# Patient Record
Sex: Male | Born: 1955 | Race: Black or African American | Hispanic: No | Smoking: Current every day smoker
Health system: Southern US, Community
[De-identification: ages and names within clinical notes are randomized; demographics above are authoritative.]

## PROBLEM LIST (undated history)

## (undated) DIAGNOSIS — B20 Human immunodeficiency virus [HIV] disease: Secondary | ICD-10-CM

## (undated) DIAGNOSIS — G40909 Epilepsy, unspecified, not intractable, without status epilepticus: Secondary | ICD-10-CM

## (undated) DIAGNOSIS — Z21 Asymptomatic human immunodeficiency virus [HIV] infection status: Secondary | ICD-10-CM

## (undated) HISTORY — PX: CRANIECTOMY: SHX331

---

## 1980-01-11 DIAGNOSIS — Z87820 Personal history of traumatic brain injury: Secondary | ICD-10-CM | POA: Insufficient documentation

## 1998-01-26 ENCOUNTER — Encounter (HOSPITAL_BASED_OUTPATIENT_CLINIC_OR_DEPARTMENT_OTHER): Payer: Self-pay | Admitting: Internal Medicine

## 1998-01-26 ENCOUNTER — Ambulatory Visit (HOSPITAL_COMMUNITY): Admission: RE | Admit: 1998-01-26 | Discharge: 1998-01-26 | Payer: Self-pay | Admitting: Internal Medicine

## 2017-01-09 DIAGNOSIS — Z87891 Personal history of nicotine dependence: Secondary | ICD-10-CM | POA: Insufficient documentation

## 2017-01-09 DIAGNOSIS — R569 Unspecified convulsions: Secondary | ICD-10-CM

## 2017-01-12 DIAGNOSIS — D61818 Other pancytopenia: Secondary | ICD-10-CM | POA: Insufficient documentation

## 2017-07-26 DIAGNOSIS — B181 Chronic viral hepatitis B without delta-agent: Secondary | ICD-10-CM | POA: Insufficient documentation

## 2017-07-26 DIAGNOSIS — M25521 Pain in right elbow: Secondary | ICD-10-CM | POA: Insufficient documentation

## 2017-09-01 DIAGNOSIS — F1290 Cannabis use, unspecified, uncomplicated: Secondary | ICD-10-CM | POA: Insufficient documentation

## 2017-09-01 DIAGNOSIS — K4020 Bilateral inguinal hernia, without obstruction or gangrene, not specified as recurrent: Secondary | ICD-10-CM | POA: Insufficient documentation

## 2017-09-06 DIAGNOSIS — D892 Hypergammaglobulinemia, unspecified: Secondary | ICD-10-CM | POA: Insufficient documentation

## 2018-06-22 ENCOUNTER — Inpatient Hospital Stay (HOSPITAL_COMMUNITY)
Admission: AD | Admit: 2018-06-22 | Discharge: 2018-06-24 | DRG: 812 | Disposition: A | Discharge status: Home / Self Care | Payer: Medicaid Other | Source: Other Acute Inpatient Hospital | Attending: Family Medicine | Admitting: Family Medicine

## 2018-06-22 ENCOUNTER — Encounter (HOSPITAL_COMMUNITY): Payer: Self-pay | Admitting: Internal Medicine

## 2018-06-22 DIAGNOSIS — R1084 Generalized abdominal pain: Secondary | ICD-10-CM | POA: Diagnosis present

## 2018-06-22 DIAGNOSIS — K921 Melena: Secondary | ICD-10-CM | POA: Diagnosis present

## 2018-06-22 DIAGNOSIS — K3189 Other diseases of stomach and duodenum: Secondary | ICD-10-CM | POA: Diagnosis present

## 2018-06-22 DIAGNOSIS — D62 Acute posthemorrhagic anemia: Secondary | ICD-10-CM | POA: Diagnosis present

## 2018-06-22 DIAGNOSIS — G9389 Other specified disorders of brain: Secondary | ICD-10-CM | POA: Diagnosis present

## 2018-06-22 DIAGNOSIS — E278 Other specified disorders of adrenal gland: Secondary | ICD-10-CM | POA: Diagnosis present

## 2018-06-22 DIAGNOSIS — D696 Thrombocytopenia, unspecified: Secondary | ICD-10-CM | POA: Diagnosis present

## 2018-06-22 DIAGNOSIS — F17210 Nicotine dependence, cigarettes, uncomplicated: Secondary | ICD-10-CM | POA: Diagnosis present

## 2018-06-22 DIAGNOSIS — K920 Hematemesis: Secondary | ICD-10-CM | POA: Diagnosis present

## 2018-06-22 DIAGNOSIS — K7460 Unspecified cirrhosis of liver: Secondary | ICD-10-CM | POA: Diagnosis present

## 2018-06-22 DIAGNOSIS — G40909 Epilepsy, unspecified, not intractable, without status epilepticus: Secondary | ICD-10-CM | POA: Diagnosis present

## 2018-06-22 DIAGNOSIS — R569 Unspecified convulsions: Secondary | ICD-10-CM

## 2018-06-22 DIAGNOSIS — R413 Other amnesia: Secondary | ICD-10-CM | POA: Diagnosis present

## 2018-06-22 DIAGNOSIS — K766 Portal hypertension: Secondary | ICD-10-CM | POA: Diagnosis present

## 2018-06-22 DIAGNOSIS — Z1159 Encounter for screening for other viral diseases: Secondary | ICD-10-CM

## 2018-06-22 DIAGNOSIS — C259 Malignant neoplasm of pancreas, unspecified: Secondary | ICD-10-CM | POA: Diagnosis present

## 2018-06-22 DIAGNOSIS — K869 Disease of pancreas, unspecified: Secondary | ICD-10-CM

## 2018-06-22 DIAGNOSIS — B20 Human immunodeficiency virus [HIV] disease: Secondary | ICD-10-CM

## 2018-06-22 HISTORY — DX: Human immunodeficiency virus (HIV) disease: B20

## 2018-06-22 HISTORY — DX: Epilepsy, unspecified, not intractable, without status epilepticus: G40.909

## 2018-06-22 HISTORY — DX: Asymptomatic human immunodeficiency virus (hiv) infection status: Z21

## 2018-06-22 MED ORDER — LEVETIRACETAM 500 MG PO TABS
1000.0000 mg | ORAL_TABLET | Freq: Two times a day (BID) | ORAL | Status: DC
  Administered 2018-06-22 – 2018-06-24 (×4): 1000 mg via ORAL
  Filled 2018-06-22 (×4): qty 2

## 2018-06-22 MED ORDER — ACETAMINOPHEN 650 MG RE SUPP: 650.0000 mg | Freq: Four times a day (QID) | RECTAL | Status: DC | PRN

## 2018-06-22 MED ORDER — PANCRELIPASE (LIP-PROT-AMYL) 12000-38000 UNITS PO CPEP: 12000.0000 [IU] | ORAL_CAPSULE | Freq: Three times a day (TID) | ORAL | Status: DC

## 2018-06-22 MED ORDER — PANTOPRAZOLE SODIUM 40 MG IV SOLR
40.0000 mg | Freq: Two times a day (BID) | INTRAVENOUS | Status: DC
  Administered 2018-06-22 – 2018-06-23 (×3): 40 mg via INTRAVENOUS
  Filled 2018-06-22 (×3): qty 40

## 2018-06-22 MED ORDER — ACETAMINOPHEN 325 MG PO TABS
650.0000 mg | ORAL_TABLET | Freq: Four times a day (QID) | ORAL | Status: DC | PRN
  Administered 2018-06-22: 650 mg via ORAL
  Filled 2018-06-22: qty 2

## 2018-06-22 MED ORDER — TENOFOVIR DISOPROXIL FUMARATE 300 MG PO TABS
300.0000 mg | ORAL_TABLET | Freq: Every day | ORAL | Status: DC
  Administered 2018-06-23 – 2018-06-24 (×2): 300 mg via ORAL
  Filled 2018-06-22 (×2): qty 1

## 2018-06-22 MED ORDER — HALOPERIDOL LACTATE 5 MG/ML IJ SOLN
1.0000 mg | INTRAMUSCULAR | Status: AC | PRN
  Administered 2018-06-23: 1 mg via INTRAVENOUS
  Filled 2018-06-22 (×2): qty 1

## 2018-06-22 MED ORDER — ONDANSETRON HCL 4 MG/2ML IJ SOLN
4.0000 mg | Freq: Four times a day (QID) | INTRAMUSCULAR | Status: DC | PRN
  Administered 2018-06-23 – 2018-06-24 (×2): 4 mg via INTRAVENOUS
  Filled 2018-06-22 (×3): qty 2

## 2018-06-22 MED ORDER — SODIUM CHLORIDE 0.9% FLUSH
3.0000 mL | Freq: Two times a day (BID) | INTRAVENOUS | Status: DC
  Administered 2018-06-22 – 2018-06-23 (×3): 3 mL via INTRAVENOUS

## 2018-06-22 MED ORDER — ONDANSETRON HCL 4 MG PO TABS: 4.0000 mg | ORAL_TABLET | Freq: Four times a day (QID) | ORAL | Status: DC | PRN

## 2018-06-22 MED ORDER — SODIUM CHLORIDE 0.9 % IV SOLN
INTRAVENOUS | Status: AC
  Administered 2018-06-22: 23:00:00 via INTRAVENOUS

## 2018-06-22 NOTE — Progress Notes (Addendum)
Terry Boyle is a 63 y.o. male patient admitted from [facility] awake, alert - oriented  X 2-3 - no acute distress noted.  VSS - Blood pressure (!) 154/93, pulse 75, weight 70.3 kg, SpO2 100 %.    IV in place, occlusive dsg intact without redness.  Orientation to room, and floor completed with information packet given to patient/family.  Patient declined safety video at this time.  Admission INP armband ID verified with patient/family, and in place.   SR up x 2, fall assessment complete, with patient and family able to verbalize understanding of risk associated with falls, and verbalized understanding to call nsg before up out of bed.  Call light within reach, patient able to voice, and demonstrate understanding.  Skin, clean-dry- intact without evidence of bruising, or skin tears.   No evidence of skin break down noted on exam.     Attempting to get in touch with Jennette Kettle MD so that orders can be placed.  Tama High, RN 06/22/2018 6:28 PM

## 2018-06-22 NOTE — H&P (Signed)
History and Physical    Terry Boyle IRS:[id number] DOB: [birth year] DOA: 06/22/2018  PCP: No primary care provider on file.  Patient coming from: [place] have personally briefly reviewed patient's old medical records in [place]  Chief Complaint: Abdominal pain  HPI: Terry Boyle is a 63 y.o. male with previously documented medical history significant for HIV, seizure disorder, ?  Pancreatic cancer who initially presented to [facility] on 06/20/2018 for generally feeling unwell and abdominal pain.    He underwent a CT abdomen/pelvis which was negative for bowel obstruction or acute bowel inflammation.  There were changes suggestive of advanced hepatic cirrhosis with trace pelvic ascites, periumbilical and right retroperitoneal varices, and normal-sized spleen.  Pancreatic head fullness without discrete mass was seen.  No biliary ductal dilation.  An indeterminate left adrenal nodule and small upper right renal cortical mass were also seen.  Is recommended he be admitted for further management of his symptoms at that time however he declined admission as he was feeling better and return to home.  He returned to [facility] ED on 06/21/2018 again with generalized abdominal pain without nausea, vomiting, or diarrhea.  He was noted to have a drop in hemoglobin from 11 to 7 and was agreeable to admission for further evaluation.  The hospitalist service at [facility] was consulted to admit as there is no GI services available at [facility].  On arrival during my examination patient states that he is starting to feel better.  He says he is mostly just having abdominal pain but also had emesis with bright red blood in the last 2 days.  He has noticed bright red blood in the stool as well as dark black-colored stool.  He denies any prior history of bleeding disorder or GI ulcer.  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  He does report smoking 1 pack of cigarettes per day.  He denies any alcohol or recreational drug use.  He is not aware of any medical conditions in his immediate family members.  He denies any known allergies to medications.  [facility] ED Course 06/21/18:  Initial vitals show BP 124/76, pulse 99, RR 17, temp 98.6 Fahrenheit, SPO2 100% on room air.  Labs showed sodium 141, potassium 3.9, chloride 111, bicarb 22, BUN 26, creatinine 0.82, serum glucose 105, lipase 32, AST 29, ALT 20, alk phos 61, total bilirubin 0.4, WBC 7.4, hemoglobin 7.0 (11.0 on 06/20/2018), hematocrit 20.3, MCV 108.6, platelets 82,000.  Urinalysis was negative for UTI or hematuria.  PT 12.1, INR 1.2.  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  CT abdomen/pelvis which was negative for bowel obstruction or acute bowel inflammation.  There were changes suggestive of advanced hepatic cirrhosis with trace pelvic ascites, periumbilical and right retroperitoneal varices, and normal-sized spleen.  Pancreatic head fullness without discrete mass was seen.  No biliary ductal dilation.  An indeterminate left adrenal nodule and small upper right renal cortical mass were also seen.   He was given morphine for pain and Zofran. Patient was transfused 3 units PRBCs and was started on Protonix, octreotide, and ceftriaxone per hospitalist recommendations transferred to [facility] arriving evening of 06/22/2018.  Review of Systems:  Full review of systems limited due to  encephalomalacia.   Past Medical History:  Diagnosis Date   HIV (human immunodeficiency virus infection) ([place])    Seizure disorder ([place])     Past Surgical History:  Procedure Laterality Date   CRANIECTOMY Left      Social History:  reports that he has been smoking cigarettes. He has been smoking about 1.00 pack per day. He has never used smokeless tobacco. He reports previous alcohol use. He reports previous drug use.  Not on File  Family History  Family history unknown: Yes     Prior to Admission medications   Not on File    Physical Exam: Vitals:   06/22/18 1807 06/22/18 1814  BP:  (!) 154/93  Pulse:  75  TempSrc:  Other (Comment)  SpO2:  100%  Weight: 70.3 kg     Constitutional: Resting in the left lateral decubitus position, NAD, calm, comfortable Eyes: PERRL, slight scleral icterus. ENMT: Mucous membranes are dry. Posterior pharynx clear of any exudate or lesions. Neck: normal, supple, no masses. Respiratory: clear to auscultation bilaterally, no wheezing, no crackles. Normal respiratory effort. No accessory muscle use.  Cardiovascular: Regular rate and rhythm, no murmurs / rubs / gallops. No extremity edema. 2+ pedal pulses.  Cap refill less than 2 seconds. Abdomen: no tenderness, no masses palpated. No hepatosplenomegaly. Bowel sounds positive.  Musculoskeletal: no clubbing / cyanosis. No joint deformity upper and lower extremities. Good ROM, no contractures. Normal muscle tone.  Skin: no rashes, lesions, ulcers. No induration Neurologic: CN 2-12 grossly intact. Sensation intact, Strength 5/5 in all 4.  Psychiatric: Alert and oriented x 3. Normal mood.     Labs on Admission: I have personally reviewed following labs and imaging studies  CBC: No results for input(s): WBC, NEUTROABS, HGB, HCT, MCV, PLT in the last 168 hours. Basic Metabolic Panel: No results for input(s): NA, K, CL, CO2, GLUCOSE, BUN, CREATININE, CALCIUM, MG, PHOS in the last 168 hours. GFR: CrCl cannot be calculated (No successful lab value found.). Liver Function Tests: No results for input(s): AST, ALT, ALKPHOS, BILITOT, PROT, ALBUMIN in the last 168 hours. No results for input(s): LIPASE, AMYLASE  in the last 168 hours. No results for input(s): AMMONIA in the last 168 hours. Coagulation Profile: No results for input(s): INR, PROTIME in the last 168 hours. Cardiac Enzymes: No results for input(s): CKTOTAL, CKMB, CKMBINDEX, TROPONINI in the last 168 hours. BNP (last 3 results) No results for input(s): PROBNP in the last 8760 hours. HbA1C: No results for input(s): HGBA1C in the last 72 hours. CBG: No results for input(s): GLUCAP in the last 168 hours. Lipid Profile: No results for input(s): CHOL, HDL, LDLCALC, TRIG, CHOLHDL, LDLDIRECT in the last 72 hours. Thyroid Function Tests: No results for input(s): TSH, T4TOTAL, FREET4, T3FREE, THYROIDAB in the last 72 hours. Anemia Panel: No results for input(s): VITAMINB12, FOLATE, FERRITIN, TIBC, IRON, RETICCTPCT in the last 72 hours. Urine analysis: No results found for: COLORURINE, APPEARANCEUR, LABSPEC, PHURINE, GLUCOSEU, HGBUR, BILIRUBINUR, KETONESUR, PROTEINUR, UROBILINOGEN, NITRITE, LEUKOCYTESUR  Radiological Exams on Admission: No results found.  EKG: Not performed  Assessment/Plan Principal Problem:   Acute blood loss anemia Active Problems:   HIV (human immunodeficiency virus infection) ([place])   Seizure disorder ([place])   Pancreatic disease  Terry Boyle is a 63 y.o. male with previously documented medical history significant for HIV, seizure disorder, ?  Pancreatic cancer who was admitted from [facility] rocking him with acute blood loss anemia.  Acute blood loss anemia: Patient with a 4 g drop  in hemoglobin (11 >> 7) at outside hospital within 24 hours.  Suspect upper GI bleed due to patient report of bloody emesis and stool. -Transfused 3 units PRBCs at OSH, obtain repeat labs and transfuse as needed -Continue Protonix IV 40 mg twice daily -Clear liquid diet now, n.p.o. at midnight -Avoid NSAIDs, hold pharmacologic VTE prophylaxis -Continue maintenance IV fluids overnight -Will need GI consult in a.m.  HIV: Per outside  hospital documentation, appears to be on tenofovir alone per outside hospital records. -Obtain HIV RNA, CD4 -Continue tenofovir for now  Seizure disorder/history of left craniectomy: Patient denies any recent seizures, he does not recall having prior craniectomy. -Continue Keppra thousand milligrams twice daily per outside records -Seizure precautions  Pancreatic disease: Outside records document a history of reported pancreatic cancer, unclear if this has been officially worked up and diagnosed.  CT abdomen/pelvis at outside hospital report pancreatic head fullness without discrete mass was seen.  No biliary ductal dilation.  An indeterminate left adrenal nodule and small upper right renal cortical mass were also seen.  Eventual further evaluation with MRI abdomen with and without IV contrast was recommended, if not worked up already.  Tobacco use: Reports smoking 1 pack/day.  Nicotine patch declined.   DVT prophylaxis: SCDs  Code Status: Full code, confirmed with patient Family Communication: Attempted to call patient contact listed under demographics Terry Boyle [phone]), number no longer in service. Disposition Plan: Pending clinical progress Consults called: None, will need GI consult in a.m. Admission status: Admit - It is my clinical opinion that admission to INPATIENT is reasonable and necessary because of the expectation that this patient will require hospital care that crosses at least 2 midnights to treat this condition based on the medical complexity of the problems presented.  Given the aforementioned information, the predictability of an adverse outcome is felt to be significant.    Zada Finders MD [facility]  If 7PM-7AM, please contact night-coverage [email or web address]  06/22/2018, 8:52 PM

## 2018-06-22 NOTE — Progress Notes (Signed)
Notified by lab approximately 2000 patient refusing labs. RN to bedside; patient confused and irritable. Stated he was nauseous, wanted medicine for nausea and wanted something to eat. Patient dificulty following conversation. Asked patient aware of location; did not directly answer question. Informed could have clear liquids (broth, jello and sprite), patient agreed. Patient asked if he was leaving tonight. Informed patient not aware leaving tonight and inquired what MD told him, patient stated no one has been here to see him. Asked if lab could draw labs while getting food, patient agreed. Lab to return once patient settled.   Upon return to room, patient welcoming, and asked for prayer. Offered Biomedical engineer. Asked if nauseous, stated no longer nausea.Set patient up to eat jello and drink sprite. Patient drinking out of hand and asking to hold his cup in his hand that was not present. Patient attempted to drink jello out of jello cup with wrapper still in place. Patient disrobed and when asked about gown, seemed confused about wearing gown. Patient stated wanted to go to sleep. Allowed patient to rest.   Protonix and Sandostatin infusions stopped.  Patient frequently yelling out of room for Terry Boyle (his girlfriend); patient can be reoriented and redirected.   2040 bed alarm sound, RN arrived to bedside patient OOB urine on bed and floor, trying to lift fall mats. Redirected back to bed. Low bed ordered.  Spoke with patient girlfriend Terry Boyle at 2105 via phone. She indicated patient uses cane at baseline and upset with family and not want family know where he was. Patient's girlfriend stated patient's sister named (Terry Boyle) and phone number  [phone].  Paged MD Terry Boyle at [phone] notify of patient confusion and high fall risk. Spoke with MD via phone. Telesitter ordered, informed will move pt. closer to RN station and low bed ordered. Informed of conversation with patient's Terry Boyle  about next of kin and sharing patient info, MD will follow-up. Haldol ordered PRN. Informed labs not yet drawn and confirmed ok not to apply Tele given irritability.    Bed alarm going off, patient in bathroom. Did not wait for assistance. Did not use urinal on bedside table and unsure of how to use urinal even with direction. Assisted back to bed and informed how to call for assistance. Patient confused.   Patient care completed and placed on low bed at 0130. Patient irritable and stated cold and wanted to sleep. Patient comforted and rested.   Telesitter initiated at [employer].   Notified NP Terry Boyle at [employer]. Stopped fluids & not able to do COVID swab due to restlessness/irritability.  Patient resting at 0200.   0548 trying to redirect patient how to use condom cath. Patient became irritable and complained of pain. Tried to clarify pain; patient repeatedly asked when he could go home. Informed day team MD will discuss dischare and plan with him. IV fluids stopped; patient turning and positioning occluding IV fluid administration; patient difficult to redirect.   Paged NP Terry Boyle at [phone] for pain med other than Tylenol. Patient complaining of abdominal pain.

## 2018-06-22 NOTE — Progress Notes (Signed)
Received call from Admissions that pt will be assigned to Doctor Posey Pronto. Awaiting orders. Will continue to monitor pt.

## 2018-06-23 DIAGNOSIS — G40909 Epilepsy, unspecified, not intractable, without status epilepticus: Secondary | ICD-10-CM

## 2018-06-23 DIAGNOSIS — B20 Human immunodeficiency virus [HIV] disease: Secondary | ICD-10-CM

## 2018-06-23 DIAGNOSIS — K869 Disease of pancreas, unspecified: Secondary | ICD-10-CM

## 2018-06-23 LAB — CBC WITH DIFFERENTIAL/PLATELET
Abs Immature Granulocytes: 0.01 10*3/uL (ref 0.00–0.07)
Basophils Absolute: 0 10*3/uL (ref 0.0–0.1)
Basophils Relative: 0 %
Eosinophils Absolute: 0.1 10*3/uL (ref 0.0–0.5)
Eosinophils Relative: 1 %
HCT: 25.4 % — ABNORMAL LOW (ref 39.0–52.0)
Hemoglobin: 9 g/dL — ABNORMAL LOW (ref 13.0–17.0)
Immature Granulocytes: 0 %
Lymphocytes Relative: 49 %
Lymphs Abs: 2.9 10*3/uL (ref 0.7–4.0)
MCH: 34.2 pg — ABNORMAL HIGH (ref 26.0–34.0)
MCHC: 35.4 g/dL (ref 30.0–36.0)
MCV: 96.6 fL (ref 80.0–100.0)
Monocytes Absolute: 0.5 10*3/uL (ref 0.1–1.0)
Monocytes Relative: 8 %
Neutro Abs: 2.6 10*3/uL (ref 1.7–7.7)
Neutrophils Relative %: 42 %
Platelets: 75 10*3/uL — ABNORMAL LOW (ref 150–400)
RBC: 2.63 MIL/uL — ABNORMAL LOW (ref 4.22–5.81)
RDW: 17.8 % — ABNORMAL HIGH (ref 11.5–15.5)
WBC: 6.4 10*3/uL (ref 4.0–10.5)
nRBC: 0.3 % — ABNORMAL HIGH (ref 0.0–0.2)

## 2018-06-23 LAB — COMPREHENSIVE METABOLIC PANEL
ALT: 25 U/L (ref 0–44)
AST: 33 U/L (ref 15–41)
Albumin: 2.6 g/dL — ABNORMAL LOW (ref 3.5–5.0)
Alkaline Phosphatase: 57 U/L (ref 38–126)
Anion gap: 6 (ref 5–15)
BUN: 21 mg/dL — ABNORMAL HIGH (ref 6–20)
CO2: 21 mmol/L — ABNORMAL LOW (ref 22–32)
Calcium: 7.7 mg/dL — ABNORMAL LOW (ref 8.9–10.3)
Chloride: 113 mmol/L — ABNORMAL HIGH (ref 98–111)
Creatinine, Ser: 1.2 mg/dL (ref 0.61–1.24)
GFR calc Af Amer: >60 mL/min (ref >60)
GFR calc non Af Amer: >60 mL/min (ref >60)
Glucose, Bld: 102 mg/dL — ABNORMAL HIGH (ref 70–99)
Potassium: 3.7 mmol/L (ref 3.5–5.1)
Sodium: 140 mmol/L (ref 135–145)
Total Bilirubin: 1.3 mg/dL — ABNORMAL HIGH (ref 0.3–1.2)
Total Protein: 4.8 g/dL — ABNORMAL LOW (ref 6.5–8.1)

## 2018-06-23 LAB — GLUCOSE, CAPILLARY
Glucose-Capillary: 99 mg/dL (ref 70–99)
Glucose-Capillary: 180 mg/dL — ABNORMAL HIGH (ref 70–99)
Glucose-Capillary: 73 mg/dL (ref 70–99)
Glucose-Capillary: 135 mg/dL — ABNORMAL HIGH (ref 70–99)

## 2018-06-23 LAB — SARS CORONAVIRUS 2: SARS Coronavirus 2: NOT DETECTED

## 2018-06-23 LAB — APTT: aPTT: 31 seconds (ref 24–36)

## 2018-06-23 LAB — PROTIME-INR
INR: 1.3 — ABNORMAL HIGH (ref 0.8–1.2)
Prothrombin Time: 16.4 seconds — ABNORMAL HIGH (ref 11.4–15.2)

## 2018-06-23 MED ORDER — LORAZEPAM 2 MG/ML IJ SOLN: 1.0000 mg | Freq: Once | INTRAMUSCULAR | Status: DC

## 2018-06-23 MED ORDER — MORPHINE SULFATE (PF) 2 MG/ML IV SOLN
2.0000 mg | Freq: Once | INTRAVENOUS | Status: AC
  Administered 2018-06-23: 2 mg via INTRAVENOUS
  Filled 2018-06-23: qty 1

## 2018-06-23 MED ORDER — SODIUM CHLORIDE 0.9 % IV SOLN
INTRAVENOUS | Status: DC
  Administered 2018-06-24: 09:00:00 via INTRAVENOUS

## 2018-06-23 MED ORDER — SODIUM CHLORIDE 0.9 % IV SOLN
25.0000 ug/h | INTRAVENOUS | Status: DC
  Administered 2018-06-23: 25 ug/h via INTRAVENOUS
  Filled 2018-06-23 (×2): qty 1

## 2018-06-23 NOTE — Progress Notes (Signed)
[facility]  PROGRESS NOTE  Terry Boyle LNL:[id number] DOB: Oct 17, 1955 DOA: 06/22/2018 PCP: No primary care provider on file.   Brief HPI:   63 year old male who was transferred from [facility] rocking him weight presented for abdominal pain had a CT scan abdomen which showed advanced hepatic cirrhosis with trace pelvic ascites, periumbilical and right peritoneal varices, pancreatic head fullness undetermined left adrenal nodule and a small upper right renal cortical mass.  Patient was noted to have hemoglobin drop from 11-7 and was transferred to [facility] for further GI evaluation.  Patient is a poor historian though he says that he is having dark stools and few episodes of coffee-ground emesis. CT scan performed for altered mental status shows changes of left craniotomy without acute intracranial changes.  Patient was given 3 units of PRBC and started on Protonix, octreotide, ceftriaxone and transferred to [facility] for further evaluation.    Subjective   Patient seen and examined, complains of nausea and pain.   Assessment/Plan:     1. Acute blood loss anemia-patient obese hemoglobin is around 11, presented with hemoglobin of 7.  Status post 3 units PRBC at outside hospital.  This morning hemoglobin is 9.0.  Continue Protonix 40 mg IV every 12 hours.  Will consult GI for further evaluation.  Patient will likely need endoscopy.  2. History of HIV-Per outside hospital documentation, appears to be on tenofovir alone per outside hospital records.  CD4, HIV RNA has been ordered.  Will follow the results.  3. 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  Continue Keppra 1000 mg twice a day.  Seizure precautions.  4. ?  Pancreatic disease-outside hospital records show history of reported pancreatic cancer, unclear if this has been worked up as outpatient.  CT scan of the abdomen pelvis only shows  pancreatic head fullness without discrete mass.  Further evaluation with MRI abdomen with and without IV contrast was recommended, will need to be done once patient is more stable.  Will discuss with GI.  5. Rapid SARS-CoV-2 test obtained in the hospital is negative.       CBG: Recent Labs  Lab 06/23/18 0838 06/23/18 1219  GLUCAP 99 180*    CBC: Recent Labs  Lab 06/23/18 0256  WBC 6.4  NEUTROABS 2.6  HGB 9.0*  HCT 25.4*  MCV 96.6  PLT 75*    Basic Metabolic Panel: Recent Labs  Lab 06/23/18 0256  NA 140  K 3.7  CL 113*  CO2 21*  GLUCOSE 102*  BUN 21*  CREATININE 1.20  CALCIUM 7.7*     DVT prophylaxis: SCDs  Code Status: Full code  Family Communication: No family at bedside  Disposition Plan: likely home when medically ready for discharge in next 3 to 4 days     Consultants:    Procedures:     Antibiotics:   Anti-infectives (From admission, onward)   Start     Dose/Rate Route Frequency Ordered Stop   06/23/18 1000  tenofovir (VIREAD) tablet 300 mg     300 mg Oral Daily 06/22/18 1907         Objective   Vitals:   06/22/18 1814 06/22/18 2225 06/23/18 0525 06/23/18 0832  BP: (!) 154/93 (!) 149/103 116/76 109/70  Pulse: 75 77 82 82  Resp:  16 16 19   Temp:  98.4 F (36.9 C) 98.6 F (37 C) 97.6 F (36.4 C)  TempSrc: Other (Comment) Oral Oral Oral  SpO2:  100% 100% 100% 100%  Weight:   70.1 kg     Intake/Output Summary (Last 24 hours) at 06/23/2018 1453 Last data filed at 06/23/2018 1305 Gross per 24 hour  Intake 686 ml  Output 1001 ml  Net -315 ml   Filed Weights   06/22/18 1807 06/23/18 0525  Weight: 70.3 kg 70.1 kg     Physical Examination:   General-appears in no acute distress Heart-S1-S2, regular, no murmur auscultated Lungs-clear to auscultation bilaterally, no wheezing or crackles auscultated Abdomen-soft, nontender, no organomegaly Extremities-no edema in the lower extremities Neuro-alert, oriented x3, no  focal deficit noted    Data Reviewed: I have personally reviewed following labs and imaging studies   Recent Results (from the past 240 hour(s))  SARS Coronavirus 2     Status: None   Collection Time: 06/23/18 12:45 PM  Result Value Ref Range Status   SARS Coronavirus 2 NOT DETECTED NOT DETECTED Final    Comment: (NOTE) SARS-CoV-2 target nucleic acids are NOT DETECTED. The SARS-CoV-2 RNA is generally detectable in upper and lower respiratory specimens during the acute phase of infection.  Negative  results do not preclude SARS-CoV-2 infection, do not rule out co-infections with other pathogens, and should not be used as the sole basis for treatment or other patient management decisions.  Negative results must be combined with clinical observations, patient history, and epidemiological information. The expected result is Not Detected. Fact Sheet for Patients: http://www.biofiredefense.com/wp-content/uploads/2020/03/BIOFIRE-COVID -19-patients.pdf Fact Sheet for Healthcare Providers: http://www.biofiredefense.com/wp-content/uploads/2020/03/BIOFIRE-COVID -19-hcp.pdf This test is not yet approved or cleared by the [place] and  has been authorized for detection and/or diagnosis of SARS-CoV-2 by FDA under an Emergency Use Authorization (EUA).  This EUA will remain in effec t (meaning this test can be used) for the duration of  the COVID-19 declaration under Section 564(b)(1) of the Act, 21 U.S.C. section 360bbb-3(b)(1), unless the authorization is terminated or revoked sooner. Performed at [facility], [place] [address], [place], [place] [address]      Liver Function Tests: Recent Labs  Lab 06/23/18 0256  AST 33  ALT 25  ALKPHOS 57  BILITOT 1.3*  PROT 4.8*  ALBUMIN 2.6*   No results for input(s): LIPASE, AMYLASE in the last 168 hours. No results for input(s): AMMONIA in the last 168 hours.  Cardiac Enzymes: No results for input(s): CKTOTAL, CKMB,  CKMBINDEX, TROPONINI in the last 168 hours. BNP (last 3 results) No results for input(s): BNP in the last 8760 hours.  ProBNP (last 3 results) No results for input(s): PROBNP in the last 8760 hours.    Studies: No results found.  Scheduled Meds: . levETIRAcetam  1,000 mg Oral BID  . LORazepam  1 mg Intravenous Once  . pantoprazole (PROTONIX) IV  40 mg Intravenous Q12H  . sodium chloride flush  3 mL Intravenous Q12H  . tenofovir  300 mg Oral Daily    Admission status: Inpatient: Based on patients clinical presentation and evaluation of above clinical data, I have made determination that patient meets Inpatient criteria at this time.  Time spent: 25 min  [facility] Pager [phone]. If 7PM-7AM, please contact night-coverage at [email or web address], Office  [phone]  password [email or web address]  06/23/2018, 2:53 PM  LOS: 1 day

## 2018-06-23 NOTE — H&P (View-Only) (Signed)
[facility] Consult  Referring Provider: [facility] Primary Care Physician:  No primary care provider on file. Primary Gastroenterologist: Althia Forts  Reason for Consultation: Black stools and coffee-ground emesis  HPI: Terry Boyle is a 63 y.o. male was transferred from [facility], where he presented with abdominal pain and had a CAT scan which showed advanced hepatic cirrhosis with trace pelvic ascites, periumbilical and right retroperitoneal varices, pancreatic head fullness, undetermined left adrenal nodule and a small upper right renal cortical mass.  Patient was noted to have a drop of hemoglobin from a baseline of 11-7 and was transferred for further GI evaluation. Patient states that he has had few days of dark stools and a few episodes of coffee-ground emesis. He is a poor historian and reports having a craniotomy and memory loss.  He does state that he has had EGD and colonoscopy in the past, cannot confirm the indication, findings or timing. As per documentation patient presented with a hemoglobin of 7, platelet 82, normal liver enzymes, INR 1.2, elevated BUN 26 with normal creatinine 0.8, lipase 62.  CAT scan performed for altered mental status showed changes of left craniectomy without acute intracranial changes. Patient was apparently given 3 units of PRBC and started on Protonix, octreotide, ceftriaxone and transferred to [facility] on 06/22/2018.  Past Medical History:  Diagnosis Date  . HIV (human immunodeficiency virus infection) ([place])   . Seizure disorder [facility])     Past Surgical History:  Procedure Laterality Date  . CRANIECTOMY Left     Prior to Admission medications   Not on File    Current Facility-Administered Medications  Medication Dose Route Frequency Provider Last Rate Last Dose  . acetaminophen (TYLENOL) tablet 650 mg  650 mg Oral Q6H PRN Lenore Cordia, MD   650 mg at 06/22/18 2359   Or  . acetaminophen (TYLENOL) suppository 650 mg  650 mg  Rectal Q6H PRN Zada Finders R, MD      . haloperidol lactate (HALDOL) injection 1 mg  1 mg Intravenous PRN Zada Finders R, MD      . levETIRAcetam (KEPPRA) tablet 1,000 mg  1,000 mg Oral BID Zada Finders R, MD   1,000 mg at 06/23/18 1140  . LORazepam (ATIVAN) injection 1 mg  1 mg Intravenous Once Schorr, Rhetta Mura, NP      . ondansetron (ZOFRAN) tablet 4 mg  4 mg Oral Q6H PRN Lenore Cordia, MD       Or  . ondansetron (ZOFRAN) injection 4 mg  4 mg Intravenous Q6H PRN Lenore Cordia, MD   4 mg at 06/23/18 0940  . pantoprazole (PROTONIX) injection 40 mg  40 mg Intravenous Q12H Zada Finders R, MD   40 mg at 06/23/18 1141  . sodium chloride flush (NS) 0.9 % injection 3 mL  3 mL Intravenous Q12H Zada Finders R, MD   3 mL at 06/23/18 1141  . tenofovir (VIREAD) tablet 300 mg  300 mg Oral Daily Zada Finders R, MD   300 mg at 06/23/18 1141    Allergies as of 06/22/2018  . (Not on File)    Family History  Family history unknown: Yes    Social History   Socioeconomic History  . Marital status: Divorced    Spouse name: Not on file  . Number of children: Not on file  . Years of education: Not on file  . Highest education level: Not on file  Occupational History  . Not on file  Social Needs  .  Financial resource strain: Not on file  . Food insecurity    Worry: Not on file    Inability: Not on file  . Transportation needs    Medical: Not on file    Non-medical: Not on file  Tobacco Use  . Smoking status: Current Every Day Smoker    Packs/day: 1.00    Types: Cigarettes  . Smokeless tobacco: Never Used  Substance and Sexual Activity  . Alcohol use: Not Currently  . Drug use: Not Currently  . Sexual activity: Not on file  Lifestyle  . Physical activity    Days per week: Not on file    Minutes per session: Not on file  . Stress: Not on file  Relationships  . Social Herbalist on phone: Not on file    Gets together: Not on file    Attends religious service: Not  on file    Active member of club or organization: Not on file    Attends meetings of clubs or organizations: Not on file    Relationship status: Not on file  . Intimate partner violence    Fear of current or ex partner: Not on file    Emotionally abused: Not on file    Physically abused: Not on file    Forced sexual activity: Not on file  Other Topics Concern  . Not on file  Social History Narrative  . Not on file    Review of Systems: Positive for: GI: Described in detail in HPI.    Gen: Denies any fever, chills, rigors, night sweats, anorexia, fatigue, weakness, malaise, involuntary weight loss, and sleep disorder CV: Denies chest pain, angina, palpitations, syncope, orthopnea, PND, peripheral edema, and claudication. Resp: Denies dyspnea, cough, sputum, wheezing, coughing up blood. GU : Denies urinary burning, blood in urine, urinary frequency, urinary hesitancy, nocturnal urination, and urinary incontinence. MS: Denies joint pain or swelling.  Denies muscle weakness, cramps, atrophy.  Derm: Denies rash, itching, oral ulcerations, hives, unhealing ulcers.  Psych: memory loss,Denies depression, anxiety,  suicidal ideation, hallucinations,  and confusion. Heme: Denies bruising and enlarged lymph nodes. Neuro: denies any headaches, dizziness, paresthesias. Endo:  Denies any problems with DM, thyroid, adrenal function.  Physical Exam: Vital signs in last 24 hours: Temp:  [97.6 F (36.4 C)-98.6 F (37 C)] 97.6 F (36.4 C) (06/13 0832) Pulse Rate:  [75-82] 82 (06/13 0832) Resp:  [16-19] 19 (06/13 0832) BP: (109-154)/(70-103) 109/70 (06/13 0832) SpO2:  [100 %] 100 % (06/13 0832) Weight:  [70.1 kg-70.3 kg] 70.1 kg (06/13 0525)    General:   Alert,  Well-developed, well-nourished, pleasant and cooperative in NAD Head:  Normocephalic and atraumatic. Eyes:  Sclera clear, no icterus.   Mild pallor Ears:  Normal auditory acuity. Nose:  No deformity, discharge,  or lesions. Mouth:   No deformity or lesions.  Oropharynx pink & moist. Neck:  Supple; no masses or thyromegaly. Lungs:  Clear throughout to auscultation.   No wheezes, crackles, or rhonchi. No acute distress. Heart:  Regular rate and rhythm; no murmurs, clicks, rubs,  or gallops. Extremities:  Without clubbing or edema. Neurologic:  Alert and  oriented x2;  grossly normal neurologically. Skin:  Intact without significant lesions or rashes. Psych:  Alert and cooperative. Normal mood and affect. Abdomen:  Soft, nontender and nondistended. No masses, hepatosplenomegaly or hernias noted. Normal bowel sounds, without guarding, and without rebound.         Lab Results: Recent Labs    06/23/18  0256  WBC 6.4  HGB 9.0*  HCT 25.4*  PLT 75*   BMET Recent Labs    06/23/18 0256  NA 140  K 3.7  CL 113*  CO2 21*  GLUCOSE 102*  BUN 21*  CREATININE 1.20  CALCIUM 7.7*   LFT Recent Labs    06/23/18 0256  PROT 4.8*  ALBUMIN 2.6*  AST 33  ALT 25  ALKPHOS 57  BILITOT 1.3*   PT/INR Recent Labs    06/23/18 0256  LABPROT 16.4*  INR 1.3*    Studies/Results: No results found.  Impression: Dark stools and coffee-ground emesis, Hb of 9 with thrombocytopenia, Elevated BUN/creatinine ratio 21/1.2, all suggestive of upper GI bleeding. As patient has cirrhosis based on CAT scan, esophageal variceal bleeding and portal hypertensive gastropathy related bleeding are possible.   Pancreatic head fullness, unknown if patient is known to have pancreatic cancer, will need an MRI with contrast as an outpatient.  History of HIV, patient denies it, he is on tenofovir. History of seizures and craniectomy.  Plan: EGD in am Need to get COVID testing performed,discussed with his hospitalist Dr.Lama, perhaps a rapid testing can be done as patient was not cooperative with nasopharyngeal testing. OK to start clear liquids, to remain NPO post midnight. Continue Protonix 40 mg BID IV, restart octreotide.        LOS: 1 day   Ronnette Juniper, MD  06/23/2018, 11:52 AM  Pager [phone] If no answer or after 5 PM call [phone]

## 2018-06-23 NOTE — Consult Note (Signed)
[facility] Consult  Referring Provider: [facility] Primary Care Physician:  No primary care provider on file. Primary Gastroenterologist: Althia Forts  Reason for Consultation: Black stools and coffee-ground emesis  HPI: Terry Boyle is a 63 y.o. male was transferred from [facility], where he presented with abdominal pain and had a CAT scan which showed advanced hepatic cirrhosis with trace pelvic ascites, periumbilical and right retroperitoneal varices, pancreatic head fullness, undetermined left adrenal nodule and a small upper right renal cortical mass.  Patient was noted to have a drop of hemoglobin from a baseline of 11-7 and was transferred for further GI evaluation. Patient states that he has had few days of dark stools and a few episodes of coffee-ground emesis. He is a poor historian and reports having a craniotomy and memory loss.  He does state that he has had EGD and colonoscopy in the past, cannot confirm the indication, findings or timing. As per documentation patient presented with a hemoglobin of 7, platelet 82, normal liver enzymes, INR 1.2, elevated BUN 26 with normal creatinine 0.8, lipase 62.  CAT scan performed for altered mental status showed changes of left craniectomy without acute intracranial changes. Patient was apparently given 3 units of PRBC and started on Protonix, octreotide, ceftriaxone and transferred to [facility] on 06/22/2018.  Past Medical History:  Diagnosis Date  . HIV (human immunodeficiency virus infection) ([place])   . Seizure disorder [facility])     Past Surgical History:  Procedure Laterality Date  . CRANIECTOMY Left     Prior to Admission medications   Not on File    Current Facility-Administered Medications  Medication Dose Route Frequency Provider Last Rate Last Dose  . acetaminophen (TYLENOL) tablet 650 mg  650 mg Oral Q6H PRN Lenore Cordia, MD   650 mg at 06/22/18 2359   Or  . acetaminophen (TYLENOL) suppository 650 mg  650 mg  Rectal Q6H PRN Zada Finders R, MD      . haloperidol lactate (HALDOL) injection 1 mg  1 mg Intravenous PRN Zada Finders R, MD      . levETIRAcetam (KEPPRA) tablet 1,000 mg  1,000 mg Oral BID Zada Finders R, MD   1,000 mg at 06/23/18 1140  . LORazepam (ATIVAN) injection 1 mg  1 mg Intravenous Once Schorr, Rhetta Mura, NP      . ondansetron (ZOFRAN) tablet 4 mg  4 mg Oral Q6H PRN Lenore Cordia, MD       Or  . ondansetron (ZOFRAN) injection 4 mg  4 mg Intravenous Q6H PRN Lenore Cordia, MD   4 mg at 06/23/18 0940  . pantoprazole (PROTONIX) injection 40 mg  40 mg Intravenous Q12H Zada Finders R, MD   40 mg at 06/23/18 1141  . sodium chloride flush (NS) 0.9 % injection 3 mL  3 mL Intravenous Q12H Zada Finders R, MD   3 mL at 06/23/18 1141  . tenofovir (VIREAD) tablet 300 mg  300 mg Oral Daily Zada Finders R, MD   300 mg at 06/23/18 1141    Allergies as of 06/22/2018  . (Not on File)    Family History  Family history unknown: Yes    Social History   Socioeconomic History  . Marital status: Divorced    Spouse name: Not on file  . Number of children: Not on file  . Years of education: Not on file  . Highest education level: Not on file  Occupational History  . Not on file  Social Needs  .  Financial resource strain: Not on file  . Food insecurity    Worry: Not on file    Inability: Not on file  . Transportation needs    Medical: Not on file    Non-medical: Not on file  Tobacco Use  . Smoking status: Current Every Day Smoker    Packs/day: 1.00    Types: Cigarettes  . Smokeless tobacco: Never Used  Substance and Sexual Activity  . Alcohol use: Not Currently  . Drug use: Not Currently  . Sexual activity: Not on file  Lifestyle  . Physical activity    Days per week: Not on file    Minutes per session: Not on file  . Stress: Not on file  Relationships  . Social Herbalist on phone: Not on file    Gets together: Not on file    Attends religious service: Not  on file    Active member of club or organization: Not on file    Attends meetings of clubs or organizations: Not on file    Relationship status: Not on file  . Intimate partner violence    Fear of current or ex partner: Not on file    Emotionally abused: Not on file    Physically abused: Not on file    Forced sexual activity: Not on file  Other Topics Concern  . Not on file  Social History Narrative  . Not on file    Review of Systems: Positive for: GI: Described in detail in HPI.    Gen: Denies any fever, chills, rigors, night sweats, anorexia, fatigue, weakness, malaise, involuntary weight loss, and sleep disorder CV: Denies chest pain, angina, palpitations, syncope, orthopnea, PND, peripheral edema, and claudication. Resp: Denies dyspnea, cough, sputum, wheezing, coughing up blood. GU : Denies urinary burning, blood in urine, urinary frequency, urinary hesitancy, nocturnal urination, and urinary incontinence. MS: Denies joint pain or swelling.  Denies muscle weakness, cramps, atrophy.  Derm: Denies rash, itching, oral ulcerations, hives, unhealing ulcers.  Psych: memory loss,Denies depression, anxiety,  suicidal ideation, hallucinations,  and confusion. Heme: Denies bruising and enlarged lymph nodes. Neuro: denies any headaches, dizziness, paresthesias. Endo:  Denies any problems with DM, thyroid, adrenal function.  Physical Exam: Vital signs in last 24 hours: Temp:  [97.6 F (36.4 C)-98.6 F (37 C)] 97.6 F (36.4 C) (06/13 0832) Pulse Rate:  [75-82] 82 (06/13 0832) Resp:  [16-19] 19 (06/13 0832) BP: (109-154)/(70-103) 109/70 (06/13 0832) SpO2:  [100 %] 100 % (06/13 0832) Weight:  [70.1 kg-70.3 kg] 70.1 kg (06/13 0525)    General:   Alert,  Well-developed, well-nourished, pleasant and cooperative in NAD Head:  Normocephalic and atraumatic. Eyes:  Sclera clear, no icterus.   Mild pallor Ears:  Normal auditory acuity. Nose:  No deformity, discharge,  or lesions. Mouth:   No deformity or lesions.  Oropharynx pink & moist. Neck:  Supple; no masses or thyromegaly. Lungs:  Clear throughout to auscultation.   No wheezes, crackles, or rhonchi. No acute distress. Heart:  Regular rate and rhythm; no murmurs, clicks, rubs,  or gallops. Extremities:  Without clubbing or edema. Neurologic:  Alert and  oriented x2;  grossly normal neurologically. Skin:  Intact without significant lesions or rashes. Psych:  Alert and cooperative. Normal mood and affect. Abdomen:  Soft, nontender and nondistended. No masses, hepatosplenomegaly or hernias noted. Normal bowel sounds, without guarding, and without rebound.         Lab Results: Recent Labs    06/23/18  0256  WBC 6.4  HGB 9.0*  HCT 25.4*  PLT 75*   BMET Recent Labs    06/23/18 0256  NA 140  K 3.7  CL 113*  CO2 21*  GLUCOSE 102*  BUN 21*  CREATININE 1.20  CALCIUM 7.7*   LFT Recent Labs    06/23/18 0256  PROT 4.8*  ALBUMIN 2.6*  AST 33  ALT 25  ALKPHOS 57  BILITOT 1.3*   PT/INR Recent Labs    06/23/18 0256  LABPROT 16.4*  INR 1.3*    Studies/Results: No results found.  Impression: Dark stools and coffee-ground emesis, Hb of 9 with thrombocytopenia, Elevated BUN/creatinine ratio 21/1.2, all suggestive of upper GI bleeding. As patient has cirrhosis based on CAT scan, esophageal variceal bleeding and portal hypertensive gastropathy related bleeding are possible.   Pancreatic head fullness, unknown if patient is known to have pancreatic cancer, will need an MRI with contrast as an outpatient.  History of HIV, patient denies it, he is on tenofovir. History of seizures and craniectomy.  Plan: EGD in am Need to get COVID testing performed,discussed with his hospitalist Dr.Lama, perhaps a rapid testing can be done as patient was not cooperative with nasopharyngeal testing. OK to start clear liquids, to remain NPO post midnight. Continue Protonix 40 mg BID IV, restart octreotide.        LOS: 1 day   Ronnette Juniper, MD  06/23/2018, 11:52 AM  Pager [phone] If no answer or after 5 PM call [phone]

## 2018-06-23 NOTE — Progress Notes (Signed)
Unable to perform COVID swab test. Unable to attempt test as patient frequently tossing, turning and difficulty following directions due to cognitive limitations and short term memory impairment. Patient irritable with increased duration of interactions and interventions. Interactions with patient must be short in duration with minimal requests or directions given to minimize irritability. Day RN informed  Patient stated he did not want sister to know anything about what he was doing nor where he was.

## 2018-06-23 NOTE — Progress Notes (Signed)
Neoma Laming, patient's sister called to inquire about her brother. Update given as to "he is doing ok" Explanation given to patient sister that we can not give her any information due to patient's wishes to not tell anyone his information. Patient sister states that she understands but just worried about her brother. Insured patient's sister that he will be asked again if it is ok but that his wishes must be respected.

## 2018-06-24 ENCOUNTER — Inpatient Hospital Stay (HOSPITAL_COMMUNITY): Payer: Medicaid Other | Admitting: Certified Registered"

## 2018-06-24 ENCOUNTER — Encounter (HOSPITAL_COMMUNITY): Payer: Self-pay | Admitting: *Deleted

## 2018-06-24 ENCOUNTER — Encounter (HOSPITAL_COMMUNITY)
Admission: AD | Disposition: A | Discharge status: Home / Self Care | Payer: Self-pay | Source: Other Acute Inpatient Hospital | Attending: Family Medicine

## 2018-06-24 HISTORY — PX: BIOPSY: SHX5522

## 2018-06-24 HISTORY — PX: ESOPHAGOGASTRODUODENOSCOPY (EGD) WITH PROPOFOL: SHX5813

## 2018-06-24 LAB — BASIC METABOLIC PANEL
Anion gap: 6 (ref 5–15)
BUN: 16 mg/dL (ref 6–20)
CO2: 23 mmol/L (ref 22–32)
Calcium: 7.7 mg/dL — ABNORMAL LOW (ref 8.9–10.3)
Chloride: 112 mmol/L — ABNORMAL HIGH (ref 98–111)
Creatinine, Ser: 1.14 mg/dL (ref 0.61–1.24)
GFR calc Af Amer: >60 mL/min (ref >60)
GFR calc non Af Amer: >60 mL/min (ref >60)
Glucose, Bld: 77 mg/dL (ref 70–99)
Potassium: 3.3 mmol/L — ABNORMAL LOW (ref 3.5–5.1)
Sodium: 141 mmol/L (ref 135–145)

## 2018-06-24 LAB — CBC
HCT: 25.7 % — ABNORMAL LOW (ref 39.0–52.0)
Hemoglobin: 9 g/dL — ABNORMAL LOW (ref 13.0–17.0)
MCH: 34.1 pg — ABNORMAL HIGH (ref 26.0–34.0)
MCHC: 35 g/dL (ref 30.0–36.0)
MCV: 97.3 fL (ref 80.0–100.0)
Platelets: 84 10*3/uL — ABNORMAL LOW (ref 150–400)
RBC: 2.64 MIL/uL — ABNORMAL LOW (ref 4.22–5.81)
RDW: 17.6 % — ABNORMAL HIGH (ref 11.5–15.5)
WBC: 5.8 10*3/uL (ref 4.0–10.5)
nRBC: 0 % (ref 0.0–0.2)

## 2018-06-24 LAB — GLUCOSE, CAPILLARY
Glucose-Capillary: 111 mg/dL — ABNORMAL HIGH (ref 70–99)
Glucose-Capillary: 119 mg/dL — ABNORMAL HIGH (ref 70–99)

## 2018-06-24 LAB — HIV-1 RNA QUANT-NO REFLEX-BLD
HIV 1 RNA Quant: <20 copies/mL
LOG10 HIV-1 RNA: UNDETERMINED log10copy/mL

## 2018-06-24 SURGERY — ESOPHAGOGASTRODUODENOSCOPY (EGD) WITH PROPOFOL
Anesthesia: Monitor Anesthesia Care

## 2018-06-24 MED ORDER — AMLODIPINE BESYLATE 10 MG PO TABS
10.0000 mg | ORAL_TABLET | Freq: Every day | ORAL | Status: DC
  Administered 2018-06-24: 10 mg via ORAL
  Filled 2018-06-24: qty 1

## 2018-06-24 MED ORDER — LEVETIRACETAM 500 MG PO TABS: 1000.0000 mg | ORAL_TABLET | Freq: Two times a day (BID) | ORAL | Status: DC

## 2018-06-24 MED ORDER — ONDANSETRON HCL 4 MG PO TABS: 8.0000 mg | ORAL_TABLET | Freq: Three times a day (TID) | ORAL | Status: DC | PRN

## 2018-06-24 MED ORDER — ALBUTEROL SULFATE (2.5 MG/3ML) 0.083% IN NEBU: 3.0000 mL | INHALATION_SOLUTION | RESPIRATORY_TRACT | Status: DC | PRN

## 2018-06-24 MED ORDER — HYDRALAZINE HCL 25 MG PO TABS
25.0000 mg | ORAL_TABLET | Freq: Once | ORAL | Status: AC
  Administered 2018-06-24: 25 mg via ORAL
  Filled 2018-06-24: qty 1

## 2018-06-24 MED ORDER — IBUPROFEN 400 MG PO TABS: 800.0000 mg | ORAL_TABLET | Freq: Three times a day (TID) | ORAL | Status: DC | PRN

## 2018-06-24 MED ORDER — PROMETHAZINE HCL 25 MG PO TABS: 25.0000 mg | ORAL_TABLET | Freq: Four times a day (QID) | ORAL | Status: DC | PRN

## 2018-06-24 MED ORDER — AMLODIPINE BESYLATE 10 MG PO TABS: 10.0000 mg | ORAL_TABLET | Freq: Every day | ORAL | 2 refills | Status: DC

## 2018-06-24 MED ORDER — PANCRELIPASE (LIP-PROT-AMYL) 12000-38000 UNITS PO CPEP
12000.0000 [IU] | ORAL_CAPSULE | Freq: Three times a day (TID) | ORAL | Status: DC
  Administered 2018-06-24: 12000 [IU] via ORAL
  Filled 2018-06-24: qty 1

## 2018-06-24 MED ORDER — TRAMADOL HCL 50 MG PO TABS: 50.0000 mg | ORAL_TABLET | Freq: Four times a day (QID) | ORAL | Status: DC | PRN

## 2018-06-24 MED ORDER — POTASSIUM CHLORIDE CRYS ER 20 MEQ PO TBCR
40.0000 meq | EXTENDED_RELEASE_TABLET | Freq: Once | ORAL | Status: AC
  Administered 2018-06-24: 40 meq via ORAL
  Filled 2018-06-24: qty 2

## 2018-06-24 MED ORDER — AMOXICILLIN 500 MG PO CAPS: 2000.0000 mg | ORAL_CAPSULE | ORAL | Status: DC

## 2018-06-24 MED ORDER — ONDANSETRON 4 MG PO TBDP: 4.0000 mg | ORAL_TABLET | Freq: Three times a day (TID) | ORAL | Status: DC | PRN

## 2018-06-24 MED ORDER — PROPOFOL 500 MG/50ML IV EMUL
INTRAVENOUS | Status: DC | PRN
  Administered 2018-06-24: 100 ug/kg/min via INTRAVENOUS

## 2018-06-24 MED ORDER — MIRTAZAPINE 15 MG PO TABS: 7.5000 mg | ORAL_TABLET | Freq: Every day | ORAL | Status: DC

## 2018-06-24 MED ORDER — TENOFOVIR DISOPROXIL FUMARATE 300 MG PO TABS: 300.0000 mg | ORAL_TABLET | Freq: Every day | ORAL | Status: DC

## 2018-06-24 MED ORDER — POTASSIUM CHLORIDE ER 10 MEQ PO TBCR
20.0000 meq | EXTENDED_RELEASE_TABLET | Freq: Every day | ORAL | Status: DC
  Administered 2018-06-24: 20 meq via ORAL
  Filled 2018-06-24 (×2): qty 2

## 2018-06-24 MED ORDER — PANTOPRAZOLE SODIUM 40 MG IV SOLR: 40.0000 mg | INTRAVENOUS | Status: DC

## 2018-06-24 MED ORDER — LIDOCAINE 2% (20 MG/ML) 5 ML SYRINGE
INTRAMUSCULAR | Status: DC | PRN
  Administered 2018-06-24: 40 mg via INTRAVENOUS

## 2018-06-24 MED ORDER — HYOSCYAMINE SULFATE 0.125 MG SL SUBL
0.1250 mg | SUBLINGUAL_TABLET | Freq: Four times a day (QID) | SUBLINGUAL | Status: DC | PRN
  Filled 2018-06-24: qty 1

## 2018-06-24 MED ORDER — PANTOPRAZOLE SODIUM 40 MG PO TBEC: 40.0000 mg | DELAYED_RELEASE_TABLET | Freq: Every day | ORAL | 0 refills | Status: DC

## 2018-06-24 SURGICAL SUPPLY — 15 items

## 2018-06-24 NOTE — Transfer of Care (Signed)
Immediate Anesthesia Transfer of Care Note  Patient: Terry Boyle  Procedure(s) Performed: ESOPHAGOGASTRODUODENOSCOPY (EGD) WITH PROPOFOL (N/A ) BIOPSY  Patient Location: PACU  Anesthesia Type:MAC  Level of Consciousness: awake, alert  and oriented  Airway & Oxygen Therapy: Patient Spontanous Breathing and Patient connected to nasal cannula oxygen  Post-op Assessment: Report given to RN and Post -op Vital signs reviewed and stable  Post vital signs: Reviewed and stable  Last Vitals:  Vitals Value Taken Time  BP    Temp    Pulse 76 06/24/18 0841  Resp 17 06/24/18 0841  SpO2 100 % 06/24/18 0841  Vitals shown include unvalidated device data.  Last Pain:  Vitals:   06/24/18 0838  TempSrc: Temporal  PainSc: 0-No pain         Complications: No apparent anesthesia complications

## 2018-06-24 NOTE — Op Note (Signed)
[facility] Patient Name: Terry Boyle Procedure Date : 06/24/2018 MRN: [id number] Attending MD: Ronnette Juniper , MD Date of Birth: [birth year] CSN: [id number] Age: 63 Admit Type: Inpatient Procedure:                Upper GI endoscopy Indications:              Coffee-ground emesis, Melena, Cirrhosis rule out                            esophageal varices Providers:                Ronnette Juniper, MD, Grace Isaac, RN, William Dalton,                            Technician Referring MD:              Medicines:                Monitored Anesthesia Care Complications:            No immediate complications. Estimated blood loss:                            None. Estimated Blood Loss:     Estimated blood loss was minimal. Procedure:                Pre-Anesthesia Assessment:                           - Prior to the procedure, a History and Physical                            was performed, and patient medications and                            allergies were reviewed. The patient's tolerance of                            previous anesthesia was also reviewed. The risks                            and benefits of the procedure and the sedation                            options and risks were discussed with the patient.                            All questions were answered, and informed consent                            was obtained. Prior Anticoagulants: The patient has                            taken no previous anticoagulant or antiplatelet                            agents. ASA Grade Assessment: II -  A patient with                            mild systemic disease. After reviewing the risks                            and benefits, the patient was deemed in                            satisfactory condition to undergo the procedure.                           After obtaining informed consent, the endoscope was                            passed under direct vision. Throughout the                       procedure, the patient's blood pressure, pulse, and                            oxygen saturations were monitored continuously. The                            GIF-H190 ([id number]) Olympus gastroscope was                            introduced through the mouth, and advanced to the                            second part of duodenum. The upper GI endoscopy was                            accomplished without difficulty. The patient                            tolerated the procedure well. Scope In: Scope Out: Findings:      The examined esophagus was normal.      The Z-line was regular and was found 40 cm from the incisors.      Severe, diffuse portal hypertensive gastropathy was found in the entire       examined stomach.      Many non-bleeding superficial gastric ulcers with a clean ulcer base       (Forrest Class III) were found in the gastric antrum. The largest lesion       was 6 mm in largest dimension. Biopsies were taken with a cold forceps       for Helicobacter pylori testing.      The cardia and gastric fundus were normal on retroflexion.      The examined duodenum was normal. Impression:               - Normal esophagus.                           - Z-line regular, 40 cm from the incisors.                           -  Portal hypertensive gastropathy.                           - Non-bleeding gastric ulcers with a clean ulcer                            base (Forrest Class III). Biopsied.                           - Normal examined duodenum. Recommendation:           - Resume regular diet.                           - Stop octreotide, continue PPI once a day.                           - Await pathology results. Procedure Code(s):        --- Professional ---                           [phone], Esophagogastroduodenoscopy, flexible,                            transoral; with biopsy, single or multiple Diagnosis Code(s):        --- Professional ---                            K76.6, Portal hypertension                           K31.89, Other diseases of stomach and duodenum                           K25.9, Gastric ulcer, unspecified as acute or                            chronic, without hemorrhage or perforation                           K92.0, Hematemesis                           K92.1, Melena (includes Hematochezia)                           K74.60, Unspecified cirrhosis of liver CPT copyright 2019 [employer]. All rights reserved. The codes documented in this report are preliminary and upon coder review may  be revised to meet current compliance requirements. Ronnette Juniper, MD 06/24/2018 8:37:42 AM This report has been signed electronically. Number of Addenda: 0

## 2018-06-24 NOTE — Discharge Summary (Addendum)
Physician Discharge Summary  Terry Boyle TKZ:[id number] DOB: [birth year] DOA: 06/22/2018  PCP: No primary care provider on file.  Admit date: 06/22/2018 Discharge date: 06/24/2018  Time spent: 40 minutes  Recommendations for Outpatient Follow-up:   1. Follow-up community health and wellness in 1 to 2 weeks  2. MRI abdomen with contrast as outpatient for pancreatic fullness, right renal cortical mass, undetermined left adrenal nodule.  3. Protonix 40 mg daily for 4 weeks.  4. Follow results for antral biopsy for H. pylori as outpatient   5. Follow CD4 count, HIV RNA results as outpatient   Discharge Diagnoses:  Principal Problem:   Acute blood loss anemia Active Problems:   HIV (human immunodeficiency virus infection) ([place])   Seizure disorder ([place])   Pancreatic disease   Discharge Condition: Stable  Diet recommendation: Regular diet  Filed Weights   06/23/18 0525 06/24/18 0536 06/24/18 0759  Weight: 70.1 kg 69.9 kg 69.9 kg    History of present illness:  63 year old male who was transferred from [facility] rocking him weight presented for abdominal pain had a CT scan abdomen which showed advanced hepatic cirrhosis with trace pelvic ascites, periumbilical and right peritoneal varices, pancreatic head fullness undetermined left adrenal nodule and a small upper right renal cortical mass.  Patient was noted to have hemoglobin drop from 11-7 and was transferred to [facility] for further GI evaluation.  Patient is a poor historian though he says that he is having dark stools and few episodes of coffee-ground emesis. CT scan performed for altered mental status shows changes of left craniotomy without acute intracranial changes.  Patient was given 3 units of PRBC and started on Protonix, octreotide, ceftriaxone and transferred to [facility] for further evaluation.   Hospital Course:   1. Acute blood loss anemia-patient baseline hemoglobin is around 11, presented with hemoglobin of 7.   Status post 3 units PRBC at outside hospital.  This morning hemoglobin is 9.0.    Patient is status post EGD, which showed portal hypertensive gastropathy, antral ulcers biopsy taken for H. pylori.  No gastric varices.  GI recommends to continue Protonix 40 mg p.o. daily for 4 weeks.   2. History of HIV-Per outside hospital documentation, appears to be on tenofovir alone per outside hospital records.  CD4, HIV RNA has been ordered.    Follow results as outpatient.  3. 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  Continue Keppra 1000 mg twice a day.  Seizure precautions.  4. ?  Pancreatic disease-outside hospital records show history of reported pancreatic cancer, unclear if this has been worked up as outpatient.  CT scan of the abdomen pelvis only shows pancreatic head fullness without discrete mass.  Further evaluation with MRI abdomen with and without IV contrast was recommended, will need to be done as outpatient.  5. Rapid SARS-CoV-2 test obtained in the hospital is negative.  6. Hypertension- BP has been elevated agter EGD, will start PRN Hydralazine 25 mg q 6 hr prn, also start Amldipine 10 mg po daily. Will discharge home on Amlodipine 10 mg daily.   Procedures:  EGD  Consultations:  Gastroenterology  Discharge Exam: Vitals:   06/24/18 0953 06/24/18 1546  BP: (!) 206/124 (!) 151/98  Pulse: 64   Resp: 17   Temp:    SpO2: 97%     General: Appears in no acute distress Cardiovascular: S1-S2, regular, no murmur auscultated Respiratory: Clear to auscultation bilaterally  Discharge Instructions  Discharge Instructions    Diet - low sodium heart healthy   Complete by: As directed    Increase activity slowly   Complete by: As directed      Allergies as of 06/24/2018   No Known Allergies     Medication List    STOP taking these medications   ondansetron 8 MG  tablet Commonly known as: ZOFRAN     TAKE these medications   albuterol 108 (90 Base) MCG/ACT inhaler Commonly known as: VENTOLIN HFA Inhale 2 puffs into the lungs every 4 (four) hours as needed for wheezing or shortness of breath.   amLODipine 10 MG tablet Commonly known as: NORVASC Take 1 tablet (10 mg total) by mouth daily.   amoxicillin 500 MG capsule Commonly known as: AMOXIL Take 2,000 mg by mouth See admin instructions. Take 4 capsules (2000 mg) by mouth one hour prior to dental appointment   hyoscyamine 0.125 MG tablet Commonly known as: LEVSIN Take 0.125 mg by mouth every 6 (six) hours as needed for cramping.   ibuprofen 800 MG tablet Commonly known as: ADVIL Take 800 mg by mouth every 8 (eight) hours as needed (pain).   levETIRAcetam 1000 MG tablet Commonly known as: KEPPRA Take 1,000 mg by mouth 2 (two) times daily.   lipase/protease/amylase 12000 units Cpep capsule Commonly known as: CREON Take 12,000 Units by mouth 3 (three) times daily with meals.   mirtazapine 7.5 MG tablet Commonly known as: REMERON Take 7.5 mg by mouth at bedtime.   ondansetron 4 MG disintegrating tablet Commonly known as: ZOFRAN-ODT Take 4 mg by mouth every 8 (eight) hours as needed for nausea or vomiting.   pantoprazole 40 MG tablet Commonly known as: Protonix Take 1 tablet (40 mg total) by mouth daily.   potassium chloride 10 MEQ tablet Commonly known as: K-DUR Take 20 mEq by mouth daily.   promethazine 25 MG tablet Commonly known as: PHENERGAN Take 25 mg by mouth every 6 (six) hours as needed for nausea or vomiting.   tenofovir 300 MG tablet Commonly known as: VIREAD Take 300 mg by mouth daily.   traMADol 50 MG tablet Commonly known as: ULTRAM Take 50-100 mg by mouth every 6 (six) hours as needed (pain).      No Known Allergies Follow-up Information    [facility]. Schedule an appointment as soon as possible for a visit in 1 week(s).    Contact information: [address] [place] [address] [phone]           The results of significant diagnostics from this hospitalization (including imaging, microbiology, ancillary and laboratory) are listed below for reference.    Significant Diagnostic Studies: No results found.  Microbiology: Recent Results (from the past 240 hour(s))  SARS Coronavirus 2     Status: None   Collection Time: 06/23/18 12:45 PM  Result Value Ref Range Status   SARS Coronavirus 2 NOT DETECTED NOT DETECTED Final    Comment: (NOTE) SARS-CoV-2 target nucleic acids are NOT DETECTED. The SARS-CoV-2 RNA is generally detectable in upper and lower respiratory specimens during the acute phase of infection.  Negative  results do not preclude SARS-CoV-2 infection, do not rule out co-infections with other pathogens, and should not be used as the sole basis for treatment or other patient management decisions.  Negative results must be combined with clinical observations, patient history, and epidemiological information. The expected result is Not Detected. Fact Sheet for Patients: http://www.biofiredefense.com/wp-content/uploads/2020/03/BIOFIRE-COVID -19-patients.pdf Fact Sheet  for Healthcare Providers: http://www.biofiredefense.com/wp-content/uploads/2020/03/BIOFIRE-COVID -19-hcp.pdf This test is not yet approved or cleared by the [place] and  has been authorized for detection and/or diagnosis of SARS-CoV-2 by FDA under an Emergency Use Authorization (EUA).  This EUA will remain in effec t (meaning this test can be used) for the duration of  the COVID-19 declaration under Section 564(b)(1) of the Act, 21 U.S.C. section 360bbb-3(b)(1), unless the authorization is terminated or revoked sooner. Performed at [facility], [place] [address], [place], [place] [address]      Labs: Basic Metabolic Panel: Recent Labs  Lab 06/23/18 0256 06/24/18 0337  NA 140  141  K 3.7 3.3*  CL 113* 112*  CO2 21* 23  GLUCOSE 102* 77  BUN 21* 16  CREATININE 1.20 1.14  CALCIUM 7.7* 7.7*   Liver Function Tests: Recent Labs  Lab 06/23/18 0256  AST 33  ALT 25  ALKPHOS 57  BILITOT 1.3*  PROT 4.8*  ALBUMIN 2.6*   No results for input(s): LIPASE, AMYLASE in the last 168 hours. No results for input(s): AMMONIA in the last 168 hours. CBC: Recent Labs  Lab 06/23/18 0256 06/24/18 0337  WBC 6.4 5.8  NEUTROABS 2.6  --   HGB 9.0* 9.0*  HCT 25.4* 25.7*  MCV 96.6 97.3  PLT 75* 84*    CBG: Recent Labs  Lab 06/23/18 1219 06/23/18 1710 06/23/18 2132 06/24/18 0939 06/24/18 1445  GLUCAP 180* 73 135* 111* 119*       Signed:  Oswald Hillock MD.  [facility] 06/24/2018, 3:48 PM

## 2018-06-24 NOTE — Anesthesia Postprocedure Evaluation (Signed)
Anesthesia Post Note  Patient: Morgan Rennert  Procedure(s) Performed: ESOPHAGOGASTRODUODENOSCOPY (EGD) WITH PROPOFOL (N/A ) BIOPSY     Patient location during evaluation: Endoscopy Anesthesia Type: MAC Level of consciousness: awake and alert Pain management: pain level controlled Vital Signs Assessment: post-procedure vital signs reviewed and stable Respiratory status: spontaneous breathing, nonlabored ventilation and respiratory function stable Cardiovascular status: stable and blood pressure returned to baseline Postop Assessment: no apparent nausea or vomiting Anesthetic complications: no    Last Vitals:  Vitals:   06/24/18 0900 06/24/18 0910  BP: (!) 208/100 (!) 177/96  Pulse: (!) 58 63  Resp: 17 20  Temp:    SpO2: 100% 100%    Last Pain:  Vitals:   06/24/18 0910  TempSrc:   PainSc: 0-No pain                 Veleda Mun,W. EDMOND

## 2018-06-24 NOTE — Interval H&P Note (Signed)
History and Physical Interval Note: 62/male with dark stools, coffee ground emesis, anemia, cirrhosis for an EGD with possible banding.  06/24/2018 8:09 AM  Terry Boyle  has presented today for EGD with possible banding, with the diagnosis of coffee ground emesis, dark stool, cirrhosis, varices.  The various methods of treatment have been discussed with the patient and family. After consideration of risks, benefits and other options for treatment, the patient has consented to  Procedure(s): ESOPHAGOGASTRODUODENOSCOPY (EGD) WITH PROPOFOL (N/A) as a surgical intervention.  The patient's history has been reviewed, patient examined, no change in status, stable for surgery.  I have reviewed the patient's chart and labs.  Questions were answered to the patient's satisfaction.     Ronnette Juniper

## 2018-06-24 NOTE — Op Note (Signed)
EGD showed  No esophageal varices Portal hypertensive gastropathy Antral ulcers, biopsies taken for H pylori No gastric varices Normal duodenal bulb and duodenum   Resume regular diet Stop octreotide PPI daily for 4 weeks Biopsy may be followed as outpatient. Ok to D/C home Recommend MRI of abdomen with contrast as outpatient for pancreatic fullness, right renal cortical mass ans undetermined left adrenal nodule.  Ronnette Juniper MD

## 2018-06-24 NOTE — Anesthesia Preprocedure Evaluation (Addendum)
Anesthesia Evaluation  Patient identified by MRN, date of birth, ID band Patient awake    Reviewed: Allergy & Precautions, H&P , NPO status , Patient's Chart, lab work & pertinent test results  Airway Mallampati: II  TM Distance: >3 FB Neck ROM: Full    Dental no notable dental hx. (+) Teeth Intact, Dental Advisory Given   Pulmonary Current Smoker,    Pulmonary exam normal breath sounds clear to auscultation       Cardiovascular negative cardio ROS   Rhythm:Regular Rate:Normal     Neuro/Psych Seizures -, Well Controlled,  negative psych ROS   GI/Hepatic negative GI ROS, Neg liver ROS,   Endo/Other  negative endocrine ROS  Renal/GU negative Renal ROS  negative genitourinary   Musculoskeletal   Abdominal   Peds  Hematology  (+) Blood dyscrasia, anemia , HIV,   Anesthesia Other Findings   Reproductive/Obstetrics negative OB ROS                            Anesthesia Physical Anesthesia Plan  ASA: II  Anesthesia Plan: MAC   Post-op Pain Management:    Induction: Intravenous  PONV Risk Score and Plan: 0 and Propofol infusion  Airway Management Planned: Nasal Cannula  Additional Equipment:   Intra-op Plan:   Post-operative Plan:   Informed Consent: I have reviewed the patients History and Physical, chart, labs and discussed the procedure including the risks, benefits and alternatives for the proposed anesthesia with the patient or authorized representative who has indicated his/her understanding and acceptance.     Dental advisory given  Plan Discussed with: CRNA  Anesthesia Plan Comments:         Anesthesia Quick Evaluation

## 2018-06-24 NOTE — Brief Op Note (Signed)
06/22/2018 - 06/24/2018  8:38 AM  PATIENT:  Terry Boyle  63 y.o. male  PRE-OPERATIVE DIAGNOSIS:  coffee ground emesis, dark stool, cirrhosis, varices  POST-OPERATIVE DIAGNOSIS:  gastric ulcers, portal hypertension gastropathy  PROCEDURE:  Procedure(s): ESOPHAGOGASTRODUODENOSCOPY (EGD) WITH PROPOFOL (N/A) BIOPSY  SURGEON:  Surgeon(s) and Role:    Ronnette Juniper, MD - Primary  PHYSICIAN ASSISTANT:   ASSISTANTS: Grace Isaac, RN, William Dalton, Tech   ANESTHESIA:   MAC  EBL:  Minimal  BLOOD ADMINISTERED:none  DRAINS: none   LOCAL MEDICATIONS USED:  NONE  SPECIMEN:  Biopsy / Limited Resection  DISPOSITION OF SPECIMEN:  PATHOLOGY  COUNTS:  YES  TOURNIQUET:  * No tourniquets in log *  DICTATION: .Dragon Dictation  PLAN OF CARE: Admit to inpatient   PATIENT DISPOSITION:  PACU - hemodynamically stable.   Delay start of Pharmacological VTE agent (>24hrs) due to surgical blood loss or risk of bleeding: no

## 2018-07-09 ENCOUNTER — Ambulatory Visit: Payer: Medicaid Other | Attending: Family Medicine | Admitting: Family Medicine

## 2018-07-09 ENCOUNTER — Other Ambulatory Visit: Payer: Self-pay

## 2018-07-09 VITALS — BP 132/87 | HR 93 | Ht 73.0 in | Wt 155.6 lb

## 2018-07-09 DIAGNOSIS — B20 Human immunodeficiency virus [HIV] disease: Secondary | ICD-10-CM

## 2018-07-09 DIAGNOSIS — M545 Low back pain, unspecified: Secondary | ICD-10-CM

## 2018-07-09 DIAGNOSIS — K2900 Acute gastritis without bleeding: Secondary | ICD-10-CM

## 2018-07-09 DIAGNOSIS — N2889 Other specified disorders of kidney and ureter: Secondary | ICD-10-CM

## 2018-07-09 DIAGNOSIS — D62 Acute posthemorrhagic anemia: Secondary | ICD-10-CM

## 2018-07-09 DIAGNOSIS — Z79899 Other long term (current) drug therapy: Secondary | ICD-10-CM

## 2018-07-09 DIAGNOSIS — G8929 Other chronic pain: Secondary | ICD-10-CM

## 2018-07-09 DIAGNOSIS — R1084 Generalized abdominal pain: Secondary | ICD-10-CM | POA: Diagnosis not present

## 2018-07-09 DIAGNOSIS — G40909 Epilepsy, unspecified, not intractable, without status epilepticus: Secondary | ICD-10-CM | POA: Diagnosis not present

## 2018-07-09 DIAGNOSIS — R935 Abnormal findings on diagnostic imaging of other abdominal regions, including retroperitoneum: Secondary | ICD-10-CM

## 2018-07-09 DIAGNOSIS — Z21 Asymptomatic human immunodeficiency virus [HIV] infection status: Secondary | ICD-10-CM

## 2018-07-09 DIAGNOSIS — E440 Moderate protein-calorie malnutrition: Secondary | ICD-10-CM

## 2018-07-09 DIAGNOSIS — I10 Essential (primary) hypertension: Secondary | ICD-10-CM

## 2018-07-09 DIAGNOSIS — Z09 Encounter for follow-up examination after completed treatment for conditions other than malignant neoplasm: Secondary | ICD-10-CM

## 2018-07-09 DIAGNOSIS — K869 Disease of pancreas, unspecified: Secondary | ICD-10-CM

## 2018-07-09 DIAGNOSIS — K259 Gastric ulcer, unspecified as acute or chronic, without hemorrhage or perforation: Secondary | ICD-10-CM

## 2018-07-09 MED ORDER — PANCRELIPASE (LIP-PROT-AMYL) 12000-38000 UNITS PO CPEP: 12000.0000 [IU] | ORAL_CAPSULE | Freq: Three times a day (TID) | ORAL | 4 refills | Status: DC

## 2018-07-09 MED ORDER — PANTOPRAZOLE SODIUM 40 MG PO TBEC: 40.0000 mg | DELAYED_RELEASE_TABLET | Freq: Every day | ORAL | 4 refills | Status: DC

## 2018-07-09 MED ORDER — TENOFOVIR DISOPROXIL FUMARATE 300 MG PO TABS: 300.0000 mg | ORAL_TABLET | Freq: Every day | ORAL | 1 refills | Status: AC

## 2018-07-09 MED ORDER — TIZANIDINE HCL 4 MG PO TABS: 4.0000 mg | ORAL_TABLET | Freq: Every day | ORAL | 4 refills | Status: DC

## 2018-07-09 MED ORDER — MIRTAZAPINE 7.5 MG PO TABS: 7.5000 mg | ORAL_TABLET | Freq: Every day | ORAL | 4 refills | Status: DC

## 2018-07-09 MED ORDER — ACETAMINOPHEN-CODEINE #3 300-30 MG PO TABS: 1.0000 | ORAL_TABLET | Freq: Four times a day (QID) | ORAL | 3 refills | Status: DC | PRN

## 2018-07-09 MED ORDER — ALBUTEROL SULFATE HFA 108 (90 BASE) MCG/ACT IN AERS: INHALATION_SPRAY | RESPIRATORY_TRACT | 4 refills | Status: AC

## 2018-07-09 MED ORDER — LEVETIRACETAM 1000 MG PO TABS: 1000.0000 mg | ORAL_TABLET | Freq: Two times a day (BID) | ORAL | 4 refills | Status: DC

## 2018-07-09 MED ORDER — ONDANSETRON 4 MG PO TBDP: 4.0000 mg | ORAL_TABLET | Freq: Three times a day (TID) | ORAL | 4 refills | Status: DC | PRN

## 2018-07-09 MED ORDER — AMLODIPINE BESYLATE 10 MG PO TABS: 10.0000 mg | ORAL_TABLET | Freq: Every day | ORAL | 2 refills | Status: DC

## 2018-07-09 NOTE — Progress Notes (Signed)
Hospital f/u.
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
## 2018-07-10 ENCOUNTER — Other Ambulatory Visit: Payer: Self-pay | Admitting: Family Medicine

## 2018-07-10 ENCOUNTER — Telehealth: Payer: Self-pay | Admitting: *Deleted

## 2018-07-10 DIAGNOSIS — N2889 Other specified disorders of kidney and ureter: Secondary | ICD-10-CM

## 2018-07-10 DIAGNOSIS — G8929 Other chronic pain: Secondary | ICD-10-CM

## 2018-07-10 DIAGNOSIS — R935 Abnormal findings on diagnostic imaging of other abdominal regions, including retroperitoneum: Secondary | ICD-10-CM

## 2018-07-10 LAB — COMPREHENSIVE METABOLIC PANEL WITH GFR
ALT: 29 IU/L (ref 0–44)
AST: 39 IU/L (ref 0–40)
Albumin/Globulin Ratio: 1.3 (ref 1.2–2.2)
Albumin: 3.9 g/dL (ref 3.8–4.9)
Alkaline Phosphatase: 100 IU/L (ref 39–117)
BUN/Creatinine Ratio: 17 (ref 10–24)
BUN: 17 mg/dL (ref 8–27)
Bilirubin Total: 0.4 mg/dL (ref 0.0–1.2)
CO2: 20 mmol/L (ref 20–29)
Calcium: 8.8 mg/dL (ref 8.6–10.2)
Chloride: 108 mmol/L — ABNORMAL HIGH (ref 96–106)
Creatinine, Ser: 1 mg/dL (ref 0.76–1.27)
GFR calc Af Amer: 94 mL/min/1.73
GFR calc non Af Amer: 81 mL/min/1.73
Globulin, Total: 2.9 g/dL (ref 1.5–4.5)
Glucose: 86 mg/dL (ref 65–99)
Potassium: 4 mmol/L (ref 3.5–5.2)
Sodium: 141 mmol/L (ref 134–144)
Total Protein: 6.8 g/dL (ref 6.0–8.5)

## 2018-07-10 LAB — CBC WITH DIFFERENTIAL/PLATELET
Basophils Absolute: 0 x10E3/uL (ref 0.0–0.2)
Basos: 0 %
EOS (ABSOLUTE): 0.1 x10E3/uL (ref 0.0–0.4)
Eos: 2 %
Hematocrit: 31.7 % — ABNORMAL LOW (ref 37.5–51.0)
Hemoglobin: 10.9 g/dL — ABNORMAL LOW (ref 13.0–17.7)
Immature Grans (Abs): 0 x10E3/uL (ref 0.0–0.1)
Immature Granulocytes: 0 %
Lymphocytes Absolute: 2.1 x10E3/uL (ref 0.7–3.1)
Lymphs: 39 %
MCH: 34.9 pg — ABNORMAL HIGH (ref 26.6–33.0)
MCHC: 34.4 g/dL (ref 31.5–35.7)
MCV: 102 fL — ABNORMAL HIGH (ref 79–97)
Monocytes Absolute: 0.4 x10E3/uL (ref 0.1–0.9)
Monocytes: 8 %
Neutrophils Absolute: 2.7 x10E3/uL (ref 1.4–7.0)
Neutrophils: 51 %
Platelets: 172 x10E3/uL (ref 150–450)
RBC: 3.12 x10E6/uL — ABNORMAL LOW (ref 4.14–5.80)
RDW: 16.6 % — ABNORMAL HIGH (ref 11.6–15.4)
WBC: 5.3 x10E3/uL (ref 3.4–10.8)

## 2018-07-10 LAB — LIPASE: Lipase: 84 U/L — ABNORMAL HIGH (ref 13–78)

## 2018-07-10 NOTE — Progress Notes (Signed)
Patient ID: Terry Boyle, male   DOB: Jun 06, 1955, 63 y.o.   MRN: [id number]   Message received that patient's MRI of the abdomen should be with and without contrast and not MRI with contrast as per hospital discharge summary suggestion

## 2018-07-10 NOTE — Telephone Encounter (Signed)
RN performed chart review to confirm HIV referral.  Unable to see proof of positivity, unable to access outside records via care everywhere.  Joycelyn Schmid will reach out to referring provider for more information. Landis Gandy, RN

## 2018-07-12 ENCOUNTER — Telehealth: Payer: Self-pay | Admitting: Emergency Medicine

## 2018-07-12 NOTE — Telephone Encounter (Signed)
Patient contacted via phone to be given results of labs.  Patient identified by name and date of birth.  Patient given results of labs.  Patient educated on lab results. Questions answered. Patient acknowledged understanding of labs results.  Patient would like and order for a back brace.  Patient advised PCP would be notified but a prescription could be delayed dur to the holiday/  Patient acknowledged understanding of advice.

## 2018-07-12 NOTE — Telephone Encounter (Signed)
Please let patient know that a back brace can be discussed at his next visit but with his current abdominal pain I am not sure that he would be able to tolerate wearing a back brace at this time

## 2018-07-15 ENCOUNTER — Encounter: Payer: Self-pay | Admitting: Family Medicine

## 2018-07-16 ENCOUNTER — Other Ambulatory Visit: Payer: Self-pay | Admitting: Family Medicine

## 2018-07-16 DIAGNOSIS — Z21 Asymptomatic human immunodeficiency virus [HIV] infection status: Secondary | ICD-10-CM

## 2018-07-16 DIAGNOSIS — B20 Human immunodeficiency virus [HIV] disease: Secondary | ICD-10-CM

## 2018-07-16 NOTE — Progress Notes (Signed)
Patient ID: Terry Boyle, male   DOB: Jul 24, 1955, 63 y.o.   MRN: [id number]   Patient was referred to infectious disease in follow-up of diagnosis of HIV infection per discharge notes however infectious disease needs patient to have HIV antibody test for confirmation as well as notes from [facility] where patient was seen prior to transfer to [place].  Patient will be contacted to come in for lab visit for HIV antibody test and to sign record release.

## 2018-07-16 NOTE — Telephone Encounter (Signed)
Left message on voicemail to  Return call

## 2018-07-17 ENCOUNTER — Telehealth: Payer: Self-pay | Admitting: *Deleted

## 2018-07-17 NOTE — Telephone Encounter (Signed)
Prior Auth for patient MRI w and w/o was completed on [employer] website and was instructed to submit additional clinical information for the clinical review. Additional clinical information was faxed to [phone] on 07-17-18. CPT Code that was used for this Prior Auth was [phone].

## 2018-07-17 NOTE — Telephone Encounter (Signed)
Patient returned missed call. Please follow up.

## 2018-07-17 NOTE — Telephone Encounter (Signed)
Prior Auth was submitted to [facility] and waiting for decision.

## 2018-07-17 NOTE — Telephone Encounter (Signed)
-----   Message from Antony Blackbird, MD sent at 07/11/2018  8:30 AM EDT ----- Regarding: FW: Mri New pre-auth will need to be obtained for Mr. Shon Baton per radiology as he needs MRI with and without contrast ----- Message ----- From: Roosvelt Maser Sent: 07/11/2018   7:19 AM EDT To: Antony Blackbird, MD Subject: RE: Doristine Mango will need to be for MR Abd w/wo ----- Message ----- From: Antony Blackbird, MD Sent: 07/10/2018   8:02 PM EDT To: Roosvelt Maser Subject: RE: Mri                                        Thanks, new order has been placed. Will authorization need to be re-done? ----- Message ----- From: Roosvelt Maser Sent: 07/10/2018   8:36 AM EDT To: Antony Blackbird, MD Subject: Mri                                             Dr. Chapman Fitch,  The mri order placed for this patient should be w/wo.  Please place new order for MR Abd w/wo..  [id number]  Thank you, Vivien Rota Team Lead Centralized Scheduling

## 2018-07-17 NOTE — Telephone Encounter (Signed)
Patients call returned.  Patient identified by name and date of birth.  Patients needs address for labs for HIV , appointment to review need for back brace and referral for MRI.  Patient acknowledged understanding of advice.

## 2018-07-19 ENCOUNTER — Other Ambulatory Visit: Payer: Self-pay

## 2018-07-19 ENCOUNTER — Other Ambulatory Visit: Payer: Self-pay | Admitting: Family Medicine

## 2018-07-19 ENCOUNTER — Telehealth: Payer: Self-pay | Admitting: *Deleted

## 2018-07-19 ENCOUNTER — Ambulatory Visit: Payer: Medicaid Other | Attending: Family Medicine

## 2018-07-19 DIAGNOSIS — M549 Dorsalgia, unspecified: Secondary | ICD-10-CM

## 2018-07-19 DIAGNOSIS — B20 Human immunodeficiency virus [HIV] disease: Secondary | ICD-10-CM

## 2018-07-19 DIAGNOSIS — G8929 Other chronic pain: Secondary | ICD-10-CM

## 2018-07-19 NOTE — Telephone Encounter (Signed)
Patient came in stating he was wanting to know if he could get prescribed a back brace. Per pt, due to his stomach issues, it's back is in pain.

## 2018-07-19 NOTE — Telephone Encounter (Signed)
LMOM

## 2018-07-19 NOTE — Telephone Encounter (Signed)
Spoke with Francena Hanly (patient girlfriend that stated she's the one that takes care of everything and to go ahead and inform her with information). Mrs. Terry Boyle Verified and confirmed patient name and DOB. Staff information her with what provider stated and she verbalized understanding.   Per Mrs. Dillard, it's okay for provider to go ahead and place that xray of the back order in.   Staff informed Mrs. Dillard of location and time and date of where that xray will be completed.

## 2018-07-19 NOTE — Progress Notes (Signed)
Patient ID: Terry Boyle, male   DOB: [birth year], 63 y.o.   MRN: [id number]   Patient with complaint of back pain and wanting back brace but as patient is new to the practice and recently moved back to the area, order placed for xrays of thoracic and lumbar spine to look for causes of his back pain

## 2018-07-19 NOTE — Telephone Encounter (Signed)
Orders placed for xrays of lumbar and thoracic spine

## 2018-07-19 NOTE — Telephone Encounter (Signed)
I do not know if we have any imaging of his back so please let him know that I will need to order an xray of his back so that there is supporting documentation of his need for a back brace

## 2018-07-20 ENCOUNTER — Other Ambulatory Visit: Payer: Medicaid Other

## 2018-07-20 LAB — HIV ANTIBODY (ROUTINE TESTING W REFLEX): HIV Screen 4th Generation wRfx: NONREACTIVE

## 2018-07-23 ENCOUNTER — Telehealth: Payer: Self-pay | Admitting: *Deleted

## 2018-07-23 NOTE — Telephone Encounter (Signed)
Please advise if staff need to cancel the Prior Auth for now or if staff need to try to get a Peer to Peer with [employer] and the you.

## 2018-07-23 NOTE — Telephone Encounter (Signed)
Called [employer] due to denial of patient MR of the Abd. Listed below are reason for denial and they are needing to be fax to them for reconsideration.   1. Do not have time Frame of Unclear Lesion was being followed. At 12 months it need to show that there was 1-2cm in size and 6-12 months 2-4cm in size.

## 2018-07-23 NOTE — Telephone Encounter (Signed)
Please let them know that I do not have that information as the patient is new to the practice and also reports that he only recently moved here to this state

## 2018-07-23 NOTE — Telephone Encounter (Signed)
Please obtain number for peer to peer review

## 2018-07-25 NOTE — Telephone Encounter (Signed)
Staff scheduled a peer to peer for provider to talk with [facility] provider Marnee Guarneri 07-26-18. [employer] number is [phone] opt 4.

## 2018-07-26 ENCOUNTER — Telehealth: Payer: Self-pay | Admitting: Family Medicine

## 2018-07-26 NOTE — Telephone Encounter (Signed)
I spoke with Dr. Marnee Guarneri, MD for peer-to-peer review regarding order for MRI of Mr. Terry Boyle, date of birth 1955-11-01.  MRI cannot be approved due to lack of information from the original CT scan which was done at [facility] prior to patient's transfer to [facility].  Information is needed regarding whether or not the CT was done with or without contrast as well as the size of the right renal lesion and size of the adrenal lesion.  At this time, the MRI request is denied but insurance company can be called for reconsideration of case.

## 2018-07-31 NOTE — Telephone Encounter (Signed)
Nurse called the patient's home phone number but received no answer and message was left on the voicemail for the patient to call back.  Return phone number given. 

## 2018-09-05 ENCOUNTER — Ambulatory Visit: Payer: Medicaid Other | Admitting: Family Medicine

## 2018-09-06 ENCOUNTER — Ambulatory Visit: Payer: Medicaid Other | Attending: Family Medicine | Admitting: Family Medicine

## 2018-09-06 ENCOUNTER — Other Ambulatory Visit: Payer: Self-pay

## 2018-09-24 ENCOUNTER — Other Ambulatory Visit: Payer: Self-pay | Admitting: Family Medicine

## 2018-09-25 ENCOUNTER — Other Ambulatory Visit: Payer: Self-pay | Admitting: Gastroenterology

## 2018-09-25 ENCOUNTER — Ambulatory Visit: Payer: Medicaid Other | Admitting: Infectious Diseases

## 2018-09-25 DIAGNOSIS — K7469 Other cirrhosis of liver: Secondary | ICD-10-CM

## 2018-09-25 DIAGNOSIS — K869 Disease of pancreas, unspecified: Secondary | ICD-10-CM

## 2018-09-25 DIAGNOSIS — N289 Disorder of kidney and ureter, unspecified: Secondary | ICD-10-CM

## 2018-09-25 DIAGNOSIS — E279 Disorder of adrenal gland, unspecified: Secondary | ICD-10-CM

## 2018-09-27 ENCOUNTER — Telehealth: Payer: Self-pay | Admitting: *Deleted

## 2018-09-27 ENCOUNTER — Telehealth: Payer: Self-pay | Admitting: Family Medicine

## 2018-09-27 NOTE — Telephone Encounter (Signed)
Patient significant other called back due to missed call. Please follow up.

## 2018-09-27 NOTE — Telephone Encounter (Signed)
Staff reviewed patients chart and there is no release that was completed by patient for [facility]. Staff called patient number on file and [facility] for them to call office so they can get release completed for records to be sent from [facility] in [place]. Waiting for call from patient and or his wife.

## 2018-09-27 NOTE — Telephone Encounter (Signed)
-----   Message from Antony Blackbird, MD sent at 09/26/2018  1:32 PM EDT ----- Regarding: Imaging needed I still have not received this patient's notes/imaging from when he was in the hospital at [facility] and therefore I have not been able to get him approved for the follow-up imaging that he needs. He and his wife came by the office one day to sign a record release. Can you find the record release, contact medical records at [facility] hospital and ask for a fax number to send the release and ask them to send us/fax his H&P, discharge summary/transfer note and any labs and imaging he had there when he was hospitalized there earlier this year then transferred to [facility]. Thanks

## 2018-09-28 ENCOUNTER — Other Ambulatory Visit: Payer: Medicaid Other

## 2018-09-28 ENCOUNTER — Telehealth (INDEPENDENT_AMBULATORY_CARE_PROVIDER_SITE_OTHER): Payer: Self-pay | Admitting: *Deleted

## 2018-09-28 NOTE — Telephone Encounter (Signed)
-----   Message from Antony Blackbird, MD sent at 09/26/2018  1:32 PM EDT ----- Regarding: Imaging needed I still have not received this patient's notes/imaging from when he was in the hospital at [facility] and therefore I have not been able to get him approved for the follow-up imaging that he needs. He and his wife came by the office one day to sign a record release. Can you find the record release, contact medical records at [facility] hospital and ask for a fax number to send the release and ask them to send us/fax his H&P, discharge summary/transfer note and any labs and imaging he had there when he was hospitalized there earlier this year then transferred to [facility]. Thanks

## 2018-09-28 NOTE — Telephone Encounter (Signed)
Spoke with patient wife and she stated she will go to [facility] and and complete release either today or latest Monday morning to get records faxed to office. Staff provided patient wife with office fax number.

## 2018-10-01 NOTE — Telephone Encounter (Signed)
Spoke with patient's wife on 09-28-18

## 2018-10-08 ENCOUNTER — Other Ambulatory Visit: Payer: Self-pay | Admitting: Family Medicine

## 2018-10-19 ENCOUNTER — Other Ambulatory Visit: Payer: Medicaid Other

## 2018-11-06 ENCOUNTER — Other Ambulatory Visit: Payer: Medicaid Other

## 2018-11-06 ENCOUNTER — Ambulatory Visit
Admission: RE | Admit: 2018-11-06 | Discharge: 2018-11-06 | Disposition: A | Discharge status: Home / Self Care | Payer: Medicaid Other | Source: Ambulatory Visit | Attending: Gastroenterology | Admitting: Gastroenterology

## 2018-11-06 DIAGNOSIS — K869 Disease of pancreas, unspecified: Secondary | ICD-10-CM

## 2018-11-06 DIAGNOSIS — K7469 Other cirrhosis of liver: Secondary | ICD-10-CM

## 2018-11-06 DIAGNOSIS — N289 Disorder of kidney and ureter, unspecified: Secondary | ICD-10-CM

## 2018-11-06 DIAGNOSIS — E279 Disorder of adrenal gland, unspecified: Secondary | ICD-10-CM

## 2018-11-06 MED ORDER — GADOBENATE DIMEGLUMINE 529 MG/ML IV SOLN
17.0000 mL | Freq: Once | INTRAVENOUS | Status: AC | PRN
  Administered 2018-11-06: 17 mL via INTRAVENOUS

## 2018-11-23 ENCOUNTER — Other Ambulatory Visit: Payer: Self-pay | Admitting: Family Medicine

## 2018-11-25 ENCOUNTER — Other Ambulatory Visit: Payer: Self-pay | Admitting: Family Medicine

## 2018-12-14 ENCOUNTER — Other Ambulatory Visit: Payer: Self-pay | Admitting: Family Medicine

## 2018-12-14 NOTE — Telephone Encounter (Signed)
Needs follow up appointment.  

## 2019-01-02 LAB — PROTIME-INR: INR: 1.1 (ref 0.9–1.1)

## 2019-01-21 ENCOUNTER — Other Ambulatory Visit: Payer: Self-pay | Admitting: Family Medicine

## 2019-01-31 ENCOUNTER — Other Ambulatory Visit: Payer: Self-pay | Admitting: Family Medicine

## 2019-03-08 ENCOUNTER — Ambulatory Visit: Payer: Medicaid Other | Admitting: Family Medicine

## 2019-03-08 ENCOUNTER — Telehealth: Payer: Self-pay | Admitting: Family Medicine

## 2019-03-08 NOTE — Telephone Encounter (Signed)
Called patient on both listed phone numbers/ unable to speak or leave VM.

## 2019-03-08 NOTE — Telephone Encounter (Signed)
Reach pt via phone/ unable to leave message due to a full voice message

## 2019-03-08 NOTE — Telephone Encounter (Signed)
Patient came in requesting to speak with his PCP. Patient has questions regarding getting an Aide. Please f/u

## 2019-03-10 ENCOUNTER — Other Ambulatory Visit: Payer: Self-pay | Admitting: Family Medicine

## 2019-03-11 NOTE — Telephone Encounter (Signed)
1) Medication(s) Requested (by name): -tiZANidine (ZANAFLEX) 4 MG tablet   2) Pharmacy of Choice: -[facility], [place]

## 2019-03-11 NOTE — Telephone Encounter (Signed)
Rx sent 

## 2019-03-13 ENCOUNTER — Other Ambulatory Visit: Payer: Self-pay | Admitting: Family Medicine

## 2019-03-13 ENCOUNTER — Telehealth: Payer: Self-pay

## 2019-03-13 DIAGNOSIS — G8929 Other chronic pain: Secondary | ICD-10-CM

## 2019-03-13 DIAGNOSIS — M549 Dorsalgia, unspecified: Secondary | ICD-10-CM

## 2019-03-13 NOTE — Telephone Encounter (Signed)
Received call from  patient. Name and DOB verified. Suggested to call  [phone] for better connection. Pt requested an Nurse Aide and a Back brace. Pt stated has requested this previously and would like to have it ASAP. Please advice!

## 2019-03-13 NOTE — Telephone Encounter (Signed)
Call placed to patient regarding and aide  and back brace.  Informed him that he will need to be seen by Dr Chapman Fitch to assess for [facility] and he can discuss the need for back brace at that time.  Appointment confirmed with him  - 03/20/2019 @ 0910.   He has not been seen by provider since 06/2018 and would need to have an encounter with provider within 120 days prior to placing a PCS referral.  This encounter can be virtual/telephonic,.

## 2019-03-13 NOTE — Telephone Encounter (Signed)
Please let patient know that at his last visit, he was asked to obtain x-rays of his thoracic and lumbar spine in follow-up of his back pain but it does not appear that he has had these done. I will place a referral so that he can be seen by Orthopedics in follow-up of his back pain and they can do xrays there if needed

## 2019-03-13 NOTE — Telephone Encounter (Signed)
Called patient at [phone], left message to call back.

## 2019-03-13 NOTE — Telephone Encounter (Signed)
Message was also sent back to [facility] to also have her contact patient and let him know that a referral would also be placed to Orthopedics in follow-up of his back pain

## 2019-03-13 NOTE — Progress Notes (Signed)
Patient ID: Terry Boyle, male   DOB: [birth year], 64 y.o.   MRN: [id number]  64 yo male with complaint of back pain who recently called regarding continued back pain as well as the need for a back brace. He has not been seen for an appointment since July 2020 and did not have the requested xrays of his thoracic and lumbar spine done, therefore will refer him to Orthopedics regarding his back pain.

## 2019-03-13 NOTE — Telephone Encounter (Signed)
Called pt / above message given as instructed by Dr Fulp/ Verbalized understanding

## 2019-03-20 ENCOUNTER — Ambulatory Visit: Payer: Medicaid Other | Attending: Family Medicine | Admitting: Family Medicine

## 2019-03-20 ENCOUNTER — Encounter: Payer: Self-pay | Admitting: Family Medicine

## 2019-03-20 ENCOUNTER — Other Ambulatory Visit: Payer: Self-pay

## 2019-03-20 VITALS — BP 126/87 | HR 68

## 2019-03-20 DIAGNOSIS — Z96641 Presence of right artificial hip joint: Secondary | ICD-10-CM | POA: Diagnosis not present

## 2019-03-20 DIAGNOSIS — F17210 Nicotine dependence, cigarettes, uncomplicated: Secondary | ICD-10-CM | POA: Diagnosis not present

## 2019-03-20 DIAGNOSIS — X58XXXA Exposure to other specified factors, initial encounter: Secondary | ICD-10-CM | POA: Diagnosis not present

## 2019-03-20 DIAGNOSIS — K869 Disease of pancreas, unspecified: Secondary | ICD-10-CM

## 2019-03-20 DIAGNOSIS — M545 Low back pain, unspecified: Secondary | ICD-10-CM

## 2019-03-20 DIAGNOSIS — M25551 Pain in right hip: Secondary | ICD-10-CM

## 2019-03-20 DIAGNOSIS — Z79899 Other long term (current) drug therapy: Secondary | ICD-10-CM | POA: Diagnosis not present

## 2019-03-20 DIAGNOSIS — Z21 Asymptomatic human immunodeficiency virus [HIV] infection status: Secondary | ICD-10-CM | POA: Insufficient documentation

## 2019-03-20 DIAGNOSIS — T8484XA Pain due to internal orthopedic prosthetic devices, implants and grafts, initial encounter: Secondary | ICD-10-CM | POA: Insufficient documentation

## 2019-03-20 DIAGNOSIS — B181 Chronic viral hepatitis B without delta-agent: Secondary | ICD-10-CM | POA: Diagnosis not present

## 2019-03-20 DIAGNOSIS — Z8719 Personal history of other diseases of the digestive system: Secondary | ICD-10-CM

## 2019-03-20 DIAGNOSIS — R634 Abnormal weight loss: Secondary | ICD-10-CM | POA: Diagnosis not present

## 2019-03-20 DIAGNOSIS — K5900 Constipation, unspecified: Secondary | ICD-10-CM | POA: Diagnosis not present

## 2019-03-20 DIAGNOSIS — G40909 Epilepsy, unspecified, not intractable, without status epilepticus: Secondary | ICD-10-CM | POA: Diagnosis present

## 2019-03-20 DIAGNOSIS — I10 Essential (primary) hypertension: Secondary | ICD-10-CM

## 2019-03-20 DIAGNOSIS — K7460 Unspecified cirrhosis of liver: Secondary | ICD-10-CM | POA: Diagnosis not present

## 2019-03-20 DIAGNOSIS — R1013 Epigastric pain: Secondary | ICD-10-CM

## 2019-03-20 DIAGNOSIS — G8929 Other chronic pain: Secondary | ICD-10-CM

## 2019-03-20 DIAGNOSIS — R110 Nausea: Secondary | ICD-10-CM

## 2019-03-20 MED ORDER — PANTOPRAZOLE SODIUM 40 MG PO TBEC: 40.0000 mg | DELAYED_RELEASE_TABLET | Freq: Every day | ORAL | 3 refills | Status: DC

## 2019-03-20 MED ORDER — ACETAMINOPHEN-CODEINE #3 300-30 MG PO TABS: 1.0000 | ORAL_TABLET | Freq: Three times a day (TID) | ORAL | 3 refills | Status: AC | PRN

## 2019-03-20 MED ORDER — HYOSCYAMINE SULFATE 0.125 MG PO TABS: 0.1250 mg | ORAL_TABLET | Freq: Four times a day (QID) | ORAL | 3 refills | Status: DC | PRN

## 2019-03-20 MED ORDER — AMLODIPINE BESYLATE 10 MG PO TABS: 10.0000 mg | ORAL_TABLET | Freq: Every day | ORAL | 2 refills | Status: DC

## 2019-03-20 MED ORDER — POLYETHYLENE GLYCOL 3350 17 GM/SCOOP PO POWD: ORAL | 11 refills | Status: DC

## 2019-03-20 MED ORDER — ONDANSETRON 4 MG PO TBDP: ORAL_TABLET | ORAL | 3 refills | Status: DC

## 2019-03-20 NOTE — Progress Notes (Signed)
Subjective:  Patient ID: Terry Boyle, male    DOB: 20-Apr-1955  Age: 64 y.o. MRN: [id number]  CC: No chief complaint on file.   HPI Terry Boyle, 64 year old African-American male, last seen in the office on 07/09/2018 who presents secondary to complaint of continued issues with abdominal pain and he reports that a few months ago, he ate something that did not agree with him and caused him to have nausea and vomiting.  He reports that he has decreased his amount of food intake as he is afraid that he will have additional vomiting.  He has had some issues with chronic nausea.  Patient has a bag of all of his medications with him at today's visit.  He reports that he is taking most of his medications but there is some that he may need refills of at this time.  The only medication that he has that he is not taking currently is his mirtazapine.  He believes that this medication was to help with sleep.  He does continue to take his blood pressure medicine and denies headaches or dizziness related to his blood pressure.  He would like a refill of nausea medicine as well as refill of medicine to help with abdominal cramping.  He reports that he is having issues with constipation.  He denies any blood in the stool or black stools.  He reports that he is taking his pancreatic enzymes.  He is also taking medication from infectious disease for continued treatment of HIV.  (On review of chart, patient actually is being treated with this medication for hepatitis B and not HIV but patient has stated at 2 different appointments that he has HIV but has tested negative for this).   he denies any issues with fever or chills, no shortness of breath or cough and no chest pain or palpitations.  He is not sure when he last followed up with infectious disease. (His medication bottles are from Dr. Posey Pronto).  He also reports compliance with Keppra for seizure disorder and he reports no recent episodes of seizures.  He reports that  he is taking his blood pressure medication as well.  He denies headaches or dizziness related to his blood pressure.        He reports that he is having chronic pain in his right lower back and right hip.  He reports prior right hip replacement.  He states that he recently restarted the use of ibuprofen for his hip pain/back pain as his pain ranges from about a 6-8 on a 0-to-10 scale is worse with standing/walking.  Patient does have history of GI bleed but denies any issues with blood in the stool or black stools he does have chronic issues with epigastric tenderness but denies any recent increase in reflux symptoms but has chronic nausea.  He also reports that his right leg is slightly longer than the left which he feels also might be contributing to his hip pain.  He denies any pain in the left hip.   Past Medical History:  Diagnosis Date  . HIV (human immunodeficiency virus infection) ([place])   . Seizure disorder [facility])     Past Surgical History:  Procedure Laterality Date  . BIOPSY  06/24/2018   Procedure: BIOPSY;  Surgeon: Ronnette Juniper, MD;  Location: [place];  Service: Gastroenterology;;  . CRANIECTOMY Left   . ESOPHAGOGASTRODUODENOSCOPY (EGD) WITH PROPOFOL N/A 06/24/2018   Procedure: ESOPHAGOGASTRODUODENOSCOPY (EGD) WITH PROPOFOL;  Surgeon: Ronnette Juniper, MD;  Location: [place];  Service: Gastroenterology;  Laterality: N/A;    Family History  Family history unknown: Yes    Social History   Tobacco Use  . Smoking status: Current Every Day Smoker    Packs/day: 1.00    Types: Cigarettes  . Smokeless tobacco: Never Used  Substance Use Topics  . Alcohol use: Not Currently    ROS Review of Systems  HENT: Negative for sore throat and trouble swallowing.   Eyes: Negative for photophobia and visual disturbance.  Respiratory: Negative for cough and shortness of breath.   Cardiovascular: Negative for chest pain, palpitations and leg swelling.  Gastrointestinal: Positive for  abdominal pain, constipation and nausea. Negative for diarrhea.  Endocrine: Negative for polydipsia, polyphagia and polyuria.  Genitourinary: Negative for dysuria and frequency.  Musculoskeletal: Positive for arthralgias, back pain and gait problem.  Neurological: Negative for dizziness and headaches.  Hematological: Negative for adenopathy. Does not bruise/bleed easily.    Objective:   Today's Vitals: Per CMA, patient would not allow his vitals to be taken when he was initially brought back to the exam room. He later agreed to have his vital signs done.  BP 126/87   Pulse 68   SpO2 95%  Physical Exam Vitals and nursing note reviewed.  Constitutional:      General: He is not in acute distress.    Appearance: Normal appearance.  Neck:     Vascular: No carotid bruit.  Cardiovascular:     Rate and Rhythm: Normal rate and regular rhythm.  Pulmonary:     Effort: Pulmonary effort is normal.     Breath sounds: Normal breath sounds.  Abdominal:     Palpations: Abdomen is soft.     Tenderness: There is abdominal tenderness (Epigastric). There is no right CVA tenderness, left CVA tenderness, guarding or rebound.  Musculoskeletal:        General: Tenderness (Patient with right lower back paraspinous spasm but no palpable area of tenderness.  Patient does have some mild discomfort at the greater trochanter of the right hip) present.     Cervical back: No tenderness.     Right lower leg: No edema.     Left lower leg: No edema.  Lymphadenopathy:     Cervical: No cervical adenopathy.  Skin:    General: Skin is warm and dry.  Neurological:     General: No focal deficit present.     Mental Status: He is alert and oriented to person, place, and time.  Psychiatric:        Mood and Affect: Mood normal.        Behavior: Behavior normal.     Comments: Patient was initially slightly withdrawn and slightly irritable but during course of visit he became less irritable and assumed a more normal  mood     Assessment & Plan:  1. Seizure disorder ([place]) He reports compliance with use of Keppra and states that he has had no additional seizures. - Comprehensive metabolic panel  2. Essential hypertension He reports compliance with amlodipine but I am not sure if he is actually taking this medication at this time.  New prescription was provided but if patient has not been taking the medication then this medication may need to be discontinued or changed to a lower dose as patient could have episodes of hypotension - amLODipine (NORVASC) 10 MG tablet; Take 1 tablet (10 mg total) by mouth daily. To lower blood pressure  Dispense: 30 tablet; Refill: 2  3. History of GI bleed He  is status post hospitalization in June 2020 from 6 12-6 14 at [facility] after being transferred from [facility].  During hospitalization, patient had EGD which showed portal hypertensive gastropathy, antral ulcers and no gastric varices.  He received 3 units packed red blood cells at the outside hospital in a [place] after hemoglobin dropped from 11 to 7 and had hemoglobin of 9.0 at time of hospital discharge.  Patient will have repeat CBC at today's visit.  We will have patient restart use of pantoprazole and patient will be referred back to gastroenterology for further evaluation and treatment. - CBC - Ambulatory referral to Gastroenterology - pantoprazole (PROTONIX) 40 MG tablet; Take 1 tablet (40 mg total) by mouth daily.  Dispense: 90 tablet; Refill: 3  4. Chronic right-sided low back pain without sciatica 5. 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  Unfortunately, patient also has history of GI bleed and he has been told to discontinue use of ibuprofen and prescription provided for Tylenol 3 to take as needed for pain.  Patient declines orthopedic  referral at this time.  He should not take tramadol for pain as it can lower the seizure threshold. - acetaminophen-codeine (TYLENOL #3) 300-30 MG tablet; Take 1 tablet by mouth every 8 (eight) hours as needed for moderate pain.  Dispense: 90 tablet; Refill: 3  6. Chronic nausea Patient reports chronic nausea issues and he believes that fear of vomiting has caused him to have abnormal weight loss.  He is provided with refill of Zofran to take as needed for nausea, Levsin to help with abdominal cramping and he has be referred to gastroenterology for further evaluation and treatment.  He will also be restarted on Protonix which he did not have a prescription bottle for at today's visit.  He is also having lipase of comprehensive metabolic panel at today's visit and follow-up - Comprehensive metabolic panel; Future - Ambulatory referral to Gastroenterology - ondansetron (ZOFRAN-ODT) 4 MG disintegrating tablet; DISSOLVE 1 TABLET IN MOUTH EVERY 8 HOURS AS NEEDED FOR NAUSEA FOR VOMITING  Dispense: 60 tablet; Refill: 3 - hyoscyamine (LEVSIN) 0.125 MG tablet; Take 1 tablet (0.125 mg total) by mouth every 6 (six) hours as needed for cramping.  Dispense: 30 tablet; Refill: 3 - Comprehensive metabolic panel  7. Pancreatic disease Patient with history of pancreatic disease and he reports that he is taking his Creon.  He has however had issues with nausea and continues to have epigastric pain.  He will have lipase noted today's visit, prescription for Zofran to help with nausea and refill of Levsin which she has taken in the past for abdominal cramping.  He is to be referred to gastroenterology for further evaluation and treatment - Ambulatory referral to Gastroenterology - ondansetron (ZOFRAN-ODT) 4 MG disintegrating tablet; DISSOLVE 1 TABLET IN MOUTH EVERY 8 HOURS AS NEEDED FOR NAUSEA FOR VOMITING  Dispense: 60 tablet; Refill: 3  8. Abnormal weight loss Patient is to be referred to infectious disease and  gastroenterology in follow-up of abnormal weight loss as he does have a history of pancreatic disease as well as hepatitis B.  Patient did have prior abnormal CT from an outside hospital but actual CT report was not able to be obtained even though patient has signed medical record release x2.  Without his CT from an outside hospital in [facility], his insurance  would never approve repeat CT or MRI which had been recommended at the time of hospital discharge of the abdomen and pelvis.  Hopefully, when he is safe for upcoming GI evaluation, gastroenterology will be able to obtain immediate CT scan for further evaluation.  We will also check comprehensive metabolic panel, and TSH to look for thyroid disorder or electrolyte abnormality or worsening of his liver disease/worsening liver enzymes which may be contributed to his issues with weight loss. - Ambulatory referral to Infectious Disease - TSH - Ambulatory referral to Gastroenterology - Comprehensive metabolic panel  9. Epigastric pain Patient with complaint of recurrent issues with epigastric pain and history of pancreatic disease.  Patient also reports chronic nausea and recent issues with weight loss.  Patient will have CBC, lipase.  Refill provided for Protonix as this medication was not with the other medication bottles the patient brought to today's visit.  He has been referred to gastroenterology for further evaluation and treatment. - CBC - Lipase - Ambulatory referral to Gastroenterology - pantoprazole (PROTONIX) 40 MG tablet; Take 1 tablet (40 mg total) by mouth daily.  Dispense: 90 tablet; Refill: 3  10. Encounter for long-term current use of medication Patient will have comprehensive metabolic panel in follow-up of his long-term use of multiple medications - Comprehensive metabolic panel  11. Constipation, unspecified constipation type Prescription provided for MiraLAX for treatment of patient may need to have this however was  prior to finding out that patient has history of chronic hepatitis.  He has been referred to both infectious disease and GI in follow-up of his history of pancreatic disease, nausea and vomiting, and weight loss.  He may need to have MiraLAX changed to lactulose to help with his liver disease to prevent ammonia buildup - polyethylene glycol powder (GLYCOLAX/MIRALAX) 17 GM/SCOOP powder; Add 1 scoop (17 gm) of powder to 8 or more ounces of liquid once daily to treat constipation  Dispense: 507 g; Refill: 11  12. Chronic hepatitis B with cirrhosis (HCC) Patient has in the past stated that his medication, tenofovir, is for the treatment of HIV and he again states that this is what the medication is for today's visit however on care everywhere review, patient appears to be on this medication for chronic hepatitis B.  Patient will have referral to infectious disease as well as GI referral for further evaluation and treatment.  Outpatient Encounter Medications as of 03/20/2019  Medication Sig  . acetaminophen-codeine (TYLENOL #3) 300-30 MG tablet Take 1 tablet by mouth every 6 (six) hours as needed for moderate pain.  Marland Kitchen albuterol (VENTOLIN HFA) 108 (90 Base) MCG/ACT inhaler 2 puffs every 4-6 hours as needed  . amLODipine (NORVASC) 10 MG tablet Take 1 tablet (10 mg total) by mouth daily.  Marland Kitchen amoxicillin (AMOXIL) 500 MG capsule Take 2,000 mg by mouth See admin instructions. Take 4 capsules (2000 mg) by mouth one hour prior to dental appointment  . hyoscyamine (LEVSIN) 0.125 MG tablet Take 0.125 mg by mouth every 6 (six) hours as needed for cramping.   Marland Kitchen ibuprofen (ADVIL) 800 MG tablet Take 800 mg by mouth every 8 (eight) hours as needed (pain).  Marland Kitchen levETIRAcetam (KEPPRA) 1000 MG tablet Take 1 tablet (1,000 mg total) by mouth 2 (two) times daily.  . lipase/protease/amylase (CREON) 12000 units CPEP capsule Take 1 capsule (12,000 Units total) by mouth 3 (three) times daily with meals.  . mirtazapine (REMERON) 7.5  MG tablet Take 1 tablet (7.5 mg total) by mouth at bedtime. Appointment  needed  . ondansetron (ZOFRAN-ODT) 4 MG disintegrating tablet DISSOLVE 1 TABLET IN MOUTH EVERY 8 HOURS AS NEEDED FOR NAUSEA FOR VOMITING  . pantoprazole (PROTONIX) 40 MG tablet Take 1 tablet by mouth once daily  . potassium chloride (K-DUR) 10 MEQ tablet Take 20 mEq by mouth daily.  . promethazine (PHENERGAN) 25 MG tablet Take 25 mg by mouth every 6 (six) hours as needed for nausea or vomiting.  Marland Kitchen tenofovir (VIREAD) 300 MG tablet Take 1 tablet (300 mg total) by mouth daily.  Marland Kitchen tiZANidine (ZANAFLEX) 4 MG tablet TAKE 1 TABLET BY MOUTH AT BEDTIME AS NEEDED FOR MUSCLE SPASM   No facility-administered encounter medications on file as of 03/20/2019.    An After Visit Summary was printed and given to the patient.   Follow-up: Return in about 4 weeks (around 04/17/2019) for weight loss/abdominal and back pain.   More than 40 minutes was spent in face-to-face time with the patient obtaining his history of current illness, review of past illnesses, review of patient's medication bottles which he brought with him today's visit, performing examination, review of systems and placing orders for blood work and specialty referrals.  An additional 12 minutes was spent for review of outside records and completion of today's office encounter.   Antony Blackbird MD

## 2019-03-20 NOTE — Patient Instructions (Signed)
Low-Sodium Eating Plan Sodium, which is an element that makes up salt, helps you maintain a healthy balance of fluids in your body. Too much sodium can increase your blood pressure and cause fluid and waste to be held in your body. Your health care provider or dietitian may recommend following this plan if you have high blood pressure (hypertension), kidney disease, liver disease, or heart failure. Eating less sodium can help lower your blood pressure, reduce swelling, and protect your heart, liver, and kidneys. What are tips for following this plan? General guidelines  Most people on this plan should limit their sodium intake to 1,500-2,000 mg (milligrams) of sodium each day. Reading food labels   The Nutrition Facts label lists the amount of sodium in one serving of the food. If you eat more than one serving, you must multiply the listed amount of sodium by the number of servings.  Choose foods with less than 140 mg of sodium per serving.  Avoid foods with 300 mg of sodium or more per serving. Shopping  Look for lower-sodium products, often labeled as "low-sodium" or "no salt added."  Always check the sodium content even if foods are labeled as "unsalted" or "no salt added".  Buy fresh foods. ? Avoid canned foods and premade or frozen meals. ? Avoid canned, cured, or processed meats  Buy breads that have less than 80 mg of sodium per slice. Cooking  Eat more home-cooked food and less restaurant, buffet, and fast food.  Avoid adding salt when cooking. Use salt-free seasonings or herbs instead of table salt or sea salt. Check with your health care provider or pharmacist before using salt substitutes.  Cook with plant-based oils, such as canola, sunflower, or olive oil. Meal planning  When eating at a restaurant, ask that your food be prepared with less salt or no salt, if possible.  Avoid foods that contain MSG (monosodium glutamate). MSG is sometimes added to Chinese food,  bouillon, and some canned foods. What foods are recommended? The items listed may not be a complete list. Talk with your dietitian about what dietary choices are best for you. Grains Low-sodium cereals, including oats, puffed wheat and rice, and shredded wheat. Low-sodium crackers. Unsalted rice. Unsalted pasta. Low-sodium bread. Whole-grain breads and whole-grain pasta. Vegetables Fresh or frozen vegetables. "No salt added" canned vegetables. "No salt added" tomato sauce and paste. Low-sodium or reduced-sodium tomato and vegetable juice. Fruits Fresh, frozen, or canned fruit. Fruit juice. Meats and other protein foods Fresh or frozen (no salt added) meat, poultry, seafood, and fish. Low-sodium canned tuna and salmon. Unsalted nuts. Dried peas, beans, and lentils without added salt. Unsalted canned beans. Eggs. Unsalted nut butters. Dairy Milk. Soy milk. Cheese that is naturally low in sodium, such as ricotta cheese, fresh mozzarella, or Swiss cheese Low-sodium or reduced-sodium cheese. Cream cheese. Yogurt. Fats and oils Unsalted butter. Unsalted margarine with no trans fat. Vegetable oils such as canola or olive oils. Seasonings and other foods Fresh and dried herbs and spices. Salt-free seasonings. Low-sodium mustard and ketchup. Sodium-free salad dressing. Sodium-free light mayonnaise. Fresh or refrigerated horseradish. Lemon juice. Vinegar. Homemade, reduced-sodium, or low-sodium soups. Unsalted popcorn and pretzels. Low-salt or salt-free chips. What foods are not recommended? The items listed may not be a complete list. Talk with your dietitian about what dietary choices are best for you. Grains Instant hot cereals. Bread stuffing, pancake, and biscuit mixes. Croutons. Seasoned rice or pasta mixes. Noodle soup cups. Boxed or frozen macaroni and cheese. Regular salted   crackers. Self-rising flour. Vegetables Sauerkraut, pickled vegetables, and relishes. Olives. French fries. Onion rings.  Regular canned vegetables (not low-sodium or reduced-sodium). Regular canned tomato sauce and paste (not low-sodium or reduced-sodium). Regular tomato and vegetable juice (not low-sodium or reduced-sodium). Frozen vegetables in sauces. Meats and other protein foods Meat or fish that is salted, canned, smoked, spiced, or pickled. Bacon, ham, sausage, hotdogs, corned beef, chipped beef, packaged lunch meats, salt pork, jerky, pickled herring, anchovies, regular canned tuna, sardines, salted nuts. Dairy Processed cheese and cheese spreads. Cheese curds. Blue cheese. Feta cheese. String cheese. Regular cottage cheese. Buttermilk. Canned milk. Fats and oils Salted butter. Regular margarine. Ghee. Bacon fat. Seasonings and other foods Onion salt, garlic salt, seasoned salt, table salt, and sea salt. Canned and packaged gravies. Worcestershire sauce. Tartar sauce. Barbecue sauce. Teriyaki sauce. Soy sauce, including reduced-sodium. Steak sauce. Fish sauce. Oyster sauce. Cocktail sauce. Horseradish that you find on the shelf. Regular ketchup and mustard. Meat flavorings and tenderizers. Bouillon cubes. Hot sauce and Tabasco sauce. Premade or packaged marinades. Premade or packaged taco seasonings. Relishes. Regular salad dressings. Salsa. Potato and tortilla chips. Corn chips and puffs. Salted popcorn and pretzels. Canned or dried soups. Pizza. Frozen entrees and pot pies. Summary  Eating less sodium can help lower your blood pressure, reduce swelling, and protect your heart, liver, and kidneys.  Most people on this plan should limit their sodium intake to 1,500-2,000 mg (milligrams) of sodium each day.  Canned, boxed, and frozen foods are high in sodium. Restaurant foods, fast foods, and pizza are also very high in sodium. You also get sodium by adding salt to food.  Try to cook at home, eat more fresh fruits and vegetables, and eat less fast food, canned, processed, or prepared foods. This information is  not intended to replace advice given to you by your health care provider. Make sure you discuss any questions you have with your health care provider. Document Revised: 12/09/2016 Document Reviewed: 12/21/2015 Elsevier Patient Education  2020 [employer] DASH Eating Plan DASH stands for "Dietary Approaches to Stop Hypertension." The DASH eating plan is a healthy eating plan that has been shown to reduce high blood pressure (hypertension). It may also reduce your risk for type 2 diabetes, heart disease, and stroke. The DASH eating plan may also help with weight loss. What are tips for following this plan?  General guidelines  Avoid eating more than 2,300 mg (milligrams) of salt (sodium) a day. If you have hypertension, you may need to reduce your sodium intake to 1,500 mg a day.  Limit alcohol intake to no more than 1 drink a day for nonpregnant women and 2 drinks a day for men. One drink equals 12 oz of beer, 5 oz of wine, or 1 oz of hard liquor.  Work with your health care provider to maintain a healthy body weight or to lose weight. Ask what an ideal weight is for you.  Get at least 30 minutes of exercise that causes your heart to beat faster (aerobic exercise) most days of the week. Activities may include walking, swimming, or biking.  Work with your health care provider or diet and nutrition specialist (dietitian) to adjust your eating plan to your individual calorie needs. Reading food labels   Check food labels for the amount of sodium per serving. Choose foods with less than 5 percent of the Daily Value of sodium. Generally, foods with less than 300 mg of sodium per serving fit into this eating plan.    To find whole grains, look for the word "whole" as the first word in the ingredient list. Shopping  Buy products labeled as "low-sodium" or "no salt added."  Buy fresh foods. Avoid canned foods and premade or frozen meals. Cooking  Avoid adding salt when cooking. Use salt-free  seasonings or herbs instead of table salt or sea salt. Check with your health care provider or pharmacist before using salt substitutes.  Do not fry foods. Cook foods using healthy methods such as baking, boiling, grilling, and broiling instead.  Cook with heart-healthy oils, such as olive, canola, soybean, or sunflower oil. Meal planning  Eat a balanced diet that includes: ? 5 or more servings of fruits and vegetables each day. At each meal, try to fill half of your plate with fruits and vegetables. ? Up to 6-8 servings of whole grains each day. ? Less than 6 oz of lean meat, poultry, or fish each day. A 3-oz serving of meat is about the same size as a deck of cards. One egg equals 1 oz. ? 2 servings of low-fat dairy each day. ? A serving of nuts, seeds, or beans 5 times each week. ? Heart-healthy fats. Healthy fats called Omega-3 fatty acids are found in foods such as flaxseeds and coldwater fish, like sardines, salmon, and mackerel.  Limit how much you eat of the following: ? Canned or prepackaged foods. ? Food that is high in trans fat, such as fried foods. ? Food that is high in saturated fat, such as fatty meat. ? Sweets, desserts, sugary drinks, and other foods with added sugar. ? Full-fat dairy products.  Do not salt foods before eating.  Try to eat at least 2 vegetarian meals each week.  Eat more home-cooked food and less restaurant, buffet, and fast food.  When eating at a restaurant, ask that your food be prepared with less salt or no salt, if possible. What foods are recommended? The items listed may not be a complete list. Talk with your dietitian about what dietary choices are best for you. Grains Whole-grain or whole-wheat bread. Whole-grain or whole-wheat pasta. Brown rice. Oatmeal. Quinoa. Bulgur. Whole-grain and low-sodium cereals. Pita bread. Low-fat, low-sodium crackers. Whole-wheat flour tortillas. Vegetables Fresh or frozen vegetables (raw, steamed, roasted, or  grilled). Low-sodium or reduced-sodium tomato and vegetable juice. Low-sodium or reduced-sodium tomato sauce and tomato paste. Low-sodium or reduced-sodium canned vegetables. Fruits All fresh, dried, or frozen fruit. Canned fruit in natural juice (without added sugar). Meat and other protein foods Skinless chicken or turkey. Ground chicken or turkey. Pork with fat trimmed off. Fish and seafood. Egg whites. Dried beans, peas, or lentils. Unsalted nuts, nut butters, and seeds. Unsalted canned beans. Lean cuts of beef with fat trimmed off. Low-sodium, lean deli meat. Dairy Low-fat (1%) or fat-free (skim) milk. Fat-free, low-fat, or reduced-fat cheeses. Nonfat, low-sodium ricotta or cottage cheese. Low-fat or nonfat yogurt. Low-fat, low-sodium cheese. Fats and oils Soft margarine without trans fats. Vegetable oil. Low-fat, reduced-fat, or light mayonnaise and salad dressings (reduced-sodium). Canola, safflower, olive, soybean, and sunflower oils. Avocado. Seasoning and other foods Herbs. Spices. Seasoning mixes without salt. Unsalted popcorn and pretzels. Fat-free sweets. What foods are not recommended? The items listed may not be a complete list. Talk with your dietitian about what dietary choices are best for you. Grains Baked goods made with fat, such as croissants, muffins, or some breads. Dry pasta or rice meal packs. Vegetables Creamed or fried vegetables. Vegetables in a cheese sauce. Regular canned vegetables (not   low-sodium or reduced-sodium). Regular canned tomato sauce and paste (not low-sodium or reduced-sodium). Regular tomato and vegetable juice (not low-sodium or reduced-sodium). Pickles. Olives. Fruits Canned fruit in a light or heavy syrup. Fried fruit. Fruit in cream or butter sauce. Meat and other protein foods Fatty cuts of meat. Ribs. Fried meat. Bacon. Sausage. Bologna and other processed lunch meats. Salami. Fatback. Hotdogs. Bratwurst. Salted nuts and seeds. Canned beans with  added salt. Canned or smoked fish. Whole eggs or egg yolks. Chicken or turkey with skin. Dairy Whole or 2% milk, cream, and half-and-half. Whole or full-fat cream cheese. Whole-fat or sweetened yogurt. Full-fat cheese. Nondairy creamers. Whipped toppings. Processed cheese and cheese spreads. Fats and oils Butter. Stick margarine. Lard. Shortening. Ghee. Bacon fat. Tropical oils, such as coconut, palm kernel, or palm oil. Seasoning and other foods Salted popcorn and pretzels. Onion salt, garlic salt, seasoned salt, table salt, and sea salt. Worcestershire sauce. Tartar sauce. Barbecue sauce. Teriyaki sauce. Soy sauce, including reduced-sodium. Steak sauce. Canned and packaged gravies. Fish sauce. Oyster sauce. Cocktail sauce. Horseradish that you find on the shelf. Ketchup. Mustard. Meat flavorings and tenderizers. Bouillon cubes. Hot sauce and Tabasco sauce. Premade or packaged marinades. Premade or packaged taco seasonings. Relishes. Regular salad dressings. Where to find more information:  National Heart, Lung, and Blood Institute: www.nhlbi.nih.gov  American Heart Association: www.heart.org Summary  The DASH eating plan is a healthy eating plan that has been shown to reduce high blood pressure (hypertension). It may also reduce your risk for type 2 diabetes, heart disease, and stroke.  With the DASH eating plan, you should limit salt (sodium) intake to 2,300 mg a day. If you have hypertension, you may need to reduce your sodium intake to 1,500 mg a day.  When on the DASH eating plan, aim to eat more fresh fruits and vegetables, whole grains, lean proteins, low-fat dairy, and heart-healthy fats.  Work with your health care provider or diet and nutrition specialist (dietitian) to adjust your eating plan to your individual calorie needs. This information is not intended to replace advice given to you by your health care provider. Make sure you discuss any questions you have with your health care  provider. Document Revised: 12/09/2016 Document Reviewed: 12/21/2015 Elsevier Patient Education  2020 Elsevier Inc.  

## 2019-03-21 LAB — COMPREHENSIVE METABOLIC PANEL WITH GFR
ALT: 19 IU/L (ref 0–44)
AST: 34 IU/L (ref 0–40)
Albumin/Globulin Ratio: 1.4 (ref 1.2–2.2)
Albumin: 4.2 g/dL (ref 3.8–4.8)
Alkaline Phosphatase: 145 IU/L — ABNORMAL HIGH (ref 39–117)
BUN/Creatinine Ratio: 14 (ref 10–24)
BUN: 15 mg/dL (ref 8–27)
Bilirubin Total: 0.4 mg/dL (ref 0.0–1.2)
CO2: 21 mmol/L (ref 20–29)
Calcium: 9.3 mg/dL (ref 8.6–10.2)
Chloride: 109 mmol/L — ABNORMAL HIGH (ref 96–106)
Creatinine, Ser: 1.08 mg/dL (ref 0.76–1.27)
GFR calc Af Amer: 85 mL/min/1.73
GFR calc non Af Amer: 74 mL/min/1.73
Globulin, Total: 2.9 g/dL (ref 1.5–4.5)
Glucose: 87 mg/dL (ref 65–99)
Potassium: 4.4 mmol/L (ref 3.5–5.2)
Sodium: 142 mmol/L (ref 134–144)
Total Protein: 7.1 g/dL (ref 6.0–8.5)

## 2019-03-21 LAB — CBC
Hematocrit: 37.2 % — ABNORMAL LOW (ref 37.5–51.0)
Hemoglobin: 13.3 g/dL (ref 13.0–17.7)
MCH: 37.9 pg — ABNORMAL HIGH (ref 26.6–33.0)
MCHC: 35.8 g/dL — ABNORMAL HIGH (ref 31.5–35.7)
MCV: 106 fL — ABNORMAL HIGH (ref 79–97)
Platelets: 132 x10E3/uL — ABNORMAL LOW (ref 150–450)
RBC: 3.51 x10E6/uL — ABNORMAL LOW (ref 4.14–5.80)
RDW: 14.3 % (ref 11.6–15.4)
WBC: 4.2 x10E3/uL (ref 3.4–10.8)

## 2019-03-21 LAB — LIPASE: Lipase: 51 U/L (ref 13–78)

## 2019-03-21 LAB — TSH: TSH: 1.36 u[IU]/mL (ref 0.450–4.500)

## 2019-03-25 ENCOUNTER — Other Ambulatory Visit: Payer: Self-pay

## 2019-03-25 ENCOUNTER — Ambulatory Visit (INDEPENDENT_AMBULATORY_CARE_PROVIDER_SITE_OTHER): Payer: Medicaid Other | Admitting: Family Medicine

## 2019-03-25 ENCOUNTER — Ambulatory Visit: Payer: Self-pay

## 2019-03-25 ENCOUNTER — Encounter: Payer: Self-pay | Admitting: Family Medicine

## 2019-03-25 DIAGNOSIS — M545 Low back pain, unspecified: Secondary | ICD-10-CM

## 2019-03-25 DIAGNOSIS — M4156 Other secondary scoliosis, lumbar region: Secondary | ICD-10-CM

## 2019-03-25 NOTE — Progress Notes (Signed)
Terry Boyle - 64 y.o. male MRN [id number]  Date of birth: [birth year]  Office Visit Note: Visit Date: 03/25/2019 PCP: Antony Blackbird, MD Referred by: Antony Blackbird, MD  Subjective: Chief Complaint  Patient presents with  . Lower Back - Pain    Makes is hard to walk, states that he had a THA and it has caused more pain. Low back and into right hip.   HPI: Terry Boyle is a 64 y.o. male who comes in today with chronic low back pain.  Reports that he has known scoliosis. He had hip replacement in 2017 in [place] after a traumatic fracture in his hip after a fall. Pain has worsened since then as his right leg is now longer and it puts more stress on his spine. Pain is worse when he moves from a seated to standing position. He gets some numbness and tingling down his right leg.  No weakness.   Cannot take NSAIDs as he has history of GI bleed in June 2020. Cannot take tramadol as this lowers seizure threshold. He reports that he cannot take tylenol due to liver problems. Has tried epidural injections in the past (in [place]; over 3 years ago) and they only provided relief for 3-5 days.   ROS Otherwise per HPI.  Assessment & Plan: Visit Diagnoses:  1. Low back pain, unspecified back pain laterality, unspecified chronicity, unspecified whether sciatica present   2. Other secondary scoliosis, lumbar region     Plan: Lumbar scoliosis with DDD throughout lumbar spine with spurring noted as well as leg length discrepancy of ~1.5 inches. Provided 7/16 inch heel lift in shoe; patient reported that it felt comfortable and improved feeling of imbalance in gait. Also placed referral to PT to help with paraspinal muscle tightness and possibly traction on spine to relieve some pressure on nerves. If no improvement, will refer for ESI.  Meds & Orders: No orders of the defined types were placed in this encounter.   Orders Placed This Encounter  Procedures  . XR Lumbar Spine 2-3 Views  .  Ambulatory referral to Physical Therapy    Follow-up: No follow-ups on file.   Procedures: No procedures performed  No notes on file   Clinical History: No specialty comments available.   He reports that he has been smoking cigarettes. He has been smoking about 1.00 pack per day. He has never used smokeless tobacco. No results for input(s): HGBA1C, LABURIC in the last 8760 hours.  Objective:  VS:  HT:    WT:   BMI:     BP:   HR: bpm  TEMP: ( )  RESP:  Physical Exam  PHYSICAL EXAM: Gen: NAD, alert, cooperative with exam, well-appearing HEENT: clear conjunctiva,  CV:  no edema, capillary refill brisk, normal rate Resp: non-labored Skin: no rashes, normal turgor  Neuro: no gross deficits.  Psych:  alert and oriented  Ortho Exam  Lumbar spine: - Inspection: rightward curvature of lumbar spine - Palpation: TTP over L4-S1 spinous processes, tight lumbar paraspinal muscles - ROM: full active ROM of the lumbar spine in flexion and extension- pain with both movements - Strength: 5/5 strength of lower extremity in L4-S1 nerve root distributions b/l - Neuro: sensation intact in the L4-S1 nerve root distribution b/l, 2+ L4 and S1 reflexes  Imaging: Lumbar scoliosis with DDD in L4-S1; spurring noted in facet joints as well R hip 16 mm higher than L   Past Medical/Family/Surgical/Social History: Medications & Allergies reviewed per EMR,  new medications updated. Patient Active Problem List   Diagnosis Date Noted  . Acute blood loss anemia 06/22/2018  . HIV (human immunodeficiency virus infection) ([place])   . Seizure disorder ([place])   . Pancreatic disease    Past Medical History:  Diagnosis Date  . HIV (human immunodeficiency virus infection) ([place])   . Seizure disorder ([place])    Family History  Family history unknown: Yes   Past Surgical History:  Procedure Laterality Date  . BIOPSY  06/24/2018   Procedure: BIOPSY;  Surgeon: Ronnette Juniper, MD;  Location: [facility];  Service:  Gastroenterology;;  . CRANIECTOMY Left   . ESOPHAGOGASTRODUODENOSCOPY (EGD) WITH PROPOFOL N/A 06/24/2018   Procedure: ESOPHAGOGASTRODUODENOSCOPY (EGD) WITH PROPOFOL;  Surgeon: Ronnette Juniper, MD;  Location: [facility];  Service: Gastroenterology;  Laterality: N/A;   Social History   Occupational History  . Not on file  Tobacco Use  . Smoking status: Current Every Day Smoker    Packs/day: 1.00    Types: Cigarettes  . Smokeless tobacco: Never Used  Substance and Sexual Activity  . Alcohol use: Not Currently  . Drug use: Not Currently  . Sexual activity: Not on file

## 2019-03-25 NOTE — Progress Notes (Signed)
I saw and examined the patient with Dr. Mayer Masker and agree with assessment and plan as outlined.    Right LBP with radicular pain, leg length discrepancy s/p right hip replacement after falling and fracturing hip.    Will treat with 7/16 Hapad lift on left; PT at [facility].  ESI if not improving.

## 2019-03-25 NOTE — Patient Instructions (Signed)
   [email or web address]

## 2019-03-26 ENCOUNTER — Ambulatory Visit: Payer: Medicaid Other | Admitting: Infectious Diseases

## 2019-03-27 ENCOUNTER — Other Ambulatory Visit: Payer: Self-pay

## 2019-03-27 ENCOUNTER — Ambulatory Visit (HOSPITAL_COMMUNITY): Payer: Medicaid Other | Attending: Family Medicine | Admitting: Physical Therapy

## 2019-03-27 ENCOUNTER — Encounter (HOSPITAL_COMMUNITY): Payer: Self-pay | Admitting: Physical Therapy

## 2019-03-27 DIAGNOSIS — M545 Low back pain: Secondary | ICD-10-CM | POA: Diagnosis not present

## 2019-03-27 DIAGNOSIS — R29898 Other symptoms and signs involving the musculoskeletal system: Secondary | ICD-10-CM

## 2019-03-27 DIAGNOSIS — G8929 Other chronic pain: Secondary | ICD-10-CM | POA: Diagnosis present

## 2019-03-27 NOTE — Therapy (Addendum)
[facility] [address] [place], [place], [address] Phone: [phone]   Fax:  [phone]  Physical Therapy Evaluation  Patient Details  Name: Terry Boyle MRN: [id number] Date of Birth: August 09, 1955 Referring Provider (PT): Eunice Blase, MD   Encounter Date: 03/27/2019  PT End of Session - 03/27/19 1243    Visit Number  1    Number of Visits  9    Date for PT Re-Evaluation  04/24/19    Authorization Type  Medicaid    Authorization Time Period  03/27/19 - 04/24/19 (3 initial visits requested, check approval)    Authorization - Visit Number  0    Authorization - Number of Visits  3    Progress Note Due on Visit  9    PT Start Time  1035    PT Stop Time  1110    PT Time Calculation (min)  35 min    Activity Tolerance  Patient tolerated treatment well    Behavior During Therapy  [facility] for tasks assessed/performed;Agitated   Agitated due to having to wait for appointment      Past Medical History:  Diagnosis Date  . HIV (human immunodeficiency virus infection) ([place])   . Seizure disorder [facility])     Past Surgical History:  Procedure Laterality Date  . BIOPSY  06/24/2018   Procedure: BIOPSY;  Surgeon: Ronnette Juniper, MD;  Location: [place];  Service: Gastroenterology;;  . CRANIECTOMY Left   . ESOPHAGOGASTRODUODENOSCOPY (EGD) WITH PROPOFOL N/A 06/24/2018   Procedure: ESOPHAGOGASTRODUODENOSCOPY (EGD) WITH PROPOFOL;  Surgeon: Ronnette Juniper, MD;  Location: [place];  Service: Gastroenterology;  Laterality: N/A;    There were no vitals filed for this visit.   Subjective Assessment - 03/27/19 1041    Subjective  Patient reported that his back has been hurting for about 30 years. He stated that he does have a history of having a right hip replacement. The patient reported that it is his low back that hurts and that it does not go down his legs. He reported that his hips are uneven and that it hurts when he stands a while. Patient denied  any changes in his bowel and bladder function. HE reported no surgeries in his back, stated that he used to get injections.    Pertinent History  RT THA per patient report    Limitations  Sitting;House hold activities;Standing;Walking    How long can you sit comfortably?  Does not like sitting for any length of time    How long can you stand comfortably?  15 minutes    How long can you walk comfortably?  15 minutes    Diagnostic tests  x-ray    Patient Stated Goals  To have less pain    Currently in Pain?  Yes    Pain Score  8     Pain Location  Back    Pain Orientation  Lower    Pain Descriptors / Indicators  Dull;Sharp    Pain Type  Chronic pain    Pain Onset  More than a month ago    Pain Frequency  Intermittent    Aggravating Factors   Rolling in bed is the most aggravating    Pain Relieving Factors  Heating pad    Effect of Pain on Daily Activities  Moderately limited         [facility] PT Assessment - 03/27/19 0001      Assessment   Medical Diagnosis  LBP  Referring Provider (PT)  Eunice Blase, MD    Onset Date/Surgical Date  --   Many years   Next MD Visit  Unknown    Prior Therapy  Yes, following hip surgery      Precautions   Precautions  None      Restrictions   Weight Bearing Restrictions  No      Prior Function   Level of Independence  Independent      Cognition   Overall Cognitive Status  Within Functional Limits for tasks assessed      Observation/Other Assessments   Observations  SLS: 3 seconds LT; 2 seconds on RT      Sensation   Light Touch  Appears Intact      ROM / Strength   AROM / PROM / Strength  AROM;Strength      AROM   AROM Assessment Site  Lumbar    Lumbar Flexion  75% limited gower's sign    Lumbar Extension  50% limited; painful     Lumbar - Right Side Bend  25% limited; painful    Lumbar - Left Side Bend  25% limited; painful     Lumbar - Right Rotation  75% limited; painful    Lumbar - Left Rotation  [facility]      Strength    Strength Assessment Site  Hip;Knee;Ankle    Right/Left Hip  Right;Left    Right Hip Flexion  5/5    Right Hip Extension  4/5    Right Hip ABduction  5/5    Left Hip Flexion  5/5    Left Hip Extension  4+/5    Left Hip ABduction  4+/5    Right/Left Knee  Right;Left    Right Knee Flexion  5/5    Right Knee Extension  5/5    Left Knee Flexion  5/5    Left Knee Extension  5/5    Right/Left Ankle  Right;Left    Right Ankle Dorsiflexion  5/5    Left Ankle Dorsiflexion  5/5      Flexibility   Soft Tissue Assessment /Muscle Length  yes    Hamstrings  WFL      Palpation   Spinal mobility  Generally hypomobile with CPAs of thoracic and lumbar spine     Palpation comment  Increased muscular restrictions in RT lumbar paraspinals      Balance   Balance Assessed  Yes                Objective measurements completed on examination: See above findings.      [facility] Adult PT Treatment/Exercise - 03/27/19 0001      Modalities   Modalities  Moist Heat      Moist Heat Therapy   Number Minutes Moist Heat  6 Minutes    Moist Heat Location  Lumbar Spine             PT Education - 03/27/19 1241    Education Details  Educated on examination findings and POC.    Person(s) Educated  Patient    Methods  Explanation    Comprehension  Verbalized understanding       PT Short Term Goals - 03/27/19 1246      PT SHORT TERM GOAL #1   Title  Patient will be educated on HEP and report regular compliance with this.    Time  2    Period  Weeks    Status  New    Target  Date  04/10/19      PT SHORT TERM GOAL #2   Title  Patient will report an overall improvement in symptoms of at least 50% to improve QOL.    Time  2    Period  Weeks    Status  New    Target Date  04/10/19        PT Long Term Goals - 03/27/19 1246      PT LONG TERM GOAL #1   Title  Patient will report an overall improvement in symptoms of at least 75% to improve QOL.    Time  4    Period  Weeks     Status  New    Target Date  04/24/19      PT LONG TERM GOAL #2   Title  Patient will demonstrate ability to perform SLS for at least 5 seconds on each LE indicating improved balance and stability.    Time  4    Period  Weeks    Status  New    Target Date  04/24/19      PT LONG TERM GOAL #3   Title  Patient will report ability to stand for at least 30 minutes in order to perform household chores with improved ease.    Time  4    Period  Weeks    Status  New    Target Date  04/24/19      PT LONG TERM GOAL #4   Title  Patient will report ability to walk for at least 30 minutes in order to perform community activity such as grocery shopping with improved ease.    Time  4    Period  Weeks    Status  New    Target Date  04/24/19             Plan - 03/27/19 1254    Clinical Impression Statement  Patient is a 64 year old male who presents to outpatient physical therapy with primary complaint of low back pain which has been ongoing for many years. Upon examination, patient demonstrated decreased lumbar ROM, some decreased strength in hips. Also noted hypomobility of the spine with spinal assessment as well as increased muscular restrictions through the lumbar paraspinals. Provided use of hot compress while sitting to improve tolerance which patient reported was helpful. Patient would benefit from continued skilled physical therapy to address the abovementioned deficits and help him return to his PLOF.    Personal Factors and Comorbidities  Age;Time since onset of injury/illness/exacerbation;Comorbidity 3    Comorbidities  HIV; seizure disorder; HTN   Examination-Activity Limitations  Stand;Bed Mobility;Locomotion Level;Bend;Carry;Sit;Squat    Examination-Participation Restrictions  Yard Work;Cleaning;Meal Prep;Community Activity;Driving;Laundry;Shop    Stability/Clinical Decision Making  Stable/Uncomplicated    Clinical Decision Making  Low    Rehab Potential  Fair    PT Frequency  2x  / week    PT Duration  4 weeks    PT Treatment/Interventions  ADLs/Self Care Home Management;Cryotherapy;Electrical Stimulation;Moist Heat;DME Instruction;Gait training;Stair training;Functional mobility training;Therapeutic activities;Therapeutic exercise;Aquatic Therapy;Balance training;Neuromuscular re-education;Patient/family education;Orthotic Fit/Training;Manual techniques;Passive range of motion;Dry needling;Energy conservation;Taping    PT Next Visit Plan  Check approval of insurance. Review eval/goals, initiate HEP including lumbar stretches, particularly SKTC and DKTC. STM to lumbar paraspinals and gentle joint mobilizations as tolerated.    PT Home Exercise Plan  Initiate at next session    Consulted and Agree with Plan of Care  Patient       Patient will  benefit from skilled therapeutic intervention in order to improve the following deficits and impairments:  Improper body mechanics, Pain, Decreased mobility, Decreased activity tolerance, Decreased endurance, Decreased range of motion, Decreased strength, Hypomobility, Decreased balance  Visit Diagnosis: Chronic bilateral low back pain, unspecified whether sciatica present  Other symptoms and signs involving the musculoskeletal system     Problem List Patient Active Problem List   Diagnosis Date Noted  . Acute blood loss anemia 06/22/2018  . HIV (human immunodeficiency virus infection) ([place])   . Seizure disorder ([place])   . Pancreatic disease    Clarene Critchley PT, DPT 12:59 PM, 03/27/19 [place], [place], [address] Phone: [phone]   Fax:  [phone]  Name: Terry Boyle MRN: [id number] Date of Birth: [birth year]

## 2019-03-29 ENCOUNTER — Encounter: Payer: Self-pay | Admitting: Infectious Diseases

## 2019-03-29 ENCOUNTER — Other Ambulatory Visit: Payer: Self-pay

## 2019-03-29 ENCOUNTER — Ambulatory Visit (INDEPENDENT_AMBULATORY_CARE_PROVIDER_SITE_OTHER): Payer: Medicaid Other | Admitting: Infectious Diseases

## 2019-03-29 VITALS — BP 120/78 | HR 83 | Wt 170.0 lb

## 2019-03-29 DIAGNOSIS — B181 Chronic viral hepatitis B without delta-agent: Secondary | ICD-10-CM

## 2019-03-29 NOTE — Progress Notes (Signed)
Patient: Terry Boyle  DOB: 1955/01/14 MRN: [id number] PCP: Antony Blackbird, MD   Referring Provider: Antony Blackbird, MD     Chief Complaint  Patient presents with  . New Patient (Initial Visit)    new hbv     Patient Active Problem List   Diagnosis Date Noted  . Acute blood loss anemia 06/22/2018  . Seizure disorder ([place])   . Pancreatic disease   . Marijuana use 09/01/2017  . Chronic hepatitis B virus infection ([place]) 07/26/2017     Subjective:  Terry Boyle is a 64 y.o. male.   He was referred to ID clinic for management of hepatitis B infection.   Chart review in Care Everywhere reveals that he established care with [facility] in 2019. He reported at that time he was diagnosed with hepatitis in 2004 with routine blood work. He was never treated for this prior to entry to care in 2019. Looks like he was given Viread in the past but does not know how long he took this nor when he stopped.   He describes no history of any abdominal pain, bowel habit changes, icterus, rashes or elevated liver function tests since he was last seen to his knowledge. He does not think he has HIV but there was confusion in the past surrounding this.   Moved back to [place] from [place] in 2019 after a prolonged hospitalization. Estimates < 5 sexual partners in his life. Never any IV drug use or intranasal drug use.  His sister died from hepatitis C in the late 1990s.  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 Previously describes himself to be a light alcohol consumer with complete cessation in 2017. 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    Review of Systems  Constitutional: Negative for chills, fever, malaise/fatigue and weight loss.  HENT: Negative for sore throat.        No dental problems  Respiratory: Negative for cough and sputum production.   Cardiovascular: Negative for chest pain and leg swelling.    Gastrointestinal: Negative for abdominal pain, diarrhea and vomiting.  Genitourinary: Negative for dysuria and flank pain.  Musculoskeletal: Negative for joint pain, myalgias and neck pain.  Skin: Negative for rash.  Neurological: Negative for dizziness, tingling and headaches.  Psychiatric/Behavioral: Negative for depression and substance abuse. The patient is not nervous/anxious and does not have insomnia.   All other systems reviewed and are negative.   Past Medical History:  Diagnosis Date  . HIV (human immunodeficiency virus infection) ([place])   . Seizure disorder [facility])     Outpatient Medications Prior to Visit  Medication Sig Dispense Refill  . acetaminophen-codeine (TYLENOL #3) 300-30 MG tablet Take 1 tablet by mouth every 8 (eight) hours as needed for moderate pain. 90 tablet 3  . albuterol (VENTOLIN HFA) 108 (90 Base) MCG/ACT inhaler 2 puffs every 4-6 hours as needed 18 g 4  . amLODipine (NORVASC) 10 MG tablet Take 1 tablet (10 mg total) by mouth daily. To lower blood pressure 30 tablet 2  . amoxicillin (AMOXIL) 500 MG capsule Take 2,000 mg by mouth See admin instructions. Take 4 capsules (2000 mg) by mouth one hour prior to dental appointment    . hyoscyamine (LEVSIN) 0.125 MG tablet Take 1 tablet (0.125 mg total) by mouth every 6 (six) hours as needed for cramping. 30 tablet 3  . levETIRAcetam (KEPPRA)  1000 MG tablet Take 1 tablet (1,000 mg total) by mouth 2 (two) times daily. 60 tablet 4  . lipase/protease/amylase (CREON) 12000 units CPEP capsule Take 1 capsule (12,000 Units total) by mouth 3 (three) times daily with meals. 270 capsule 4  . ondansetron (ZOFRAN-ODT) 4 MG disintegrating tablet DISSOLVE 1 TABLET IN MOUTH EVERY 8 HOURS AS NEEDED FOR NAUSEA FOR VOMITING 60 tablet 3  . pantoprazole (PROTONIX) 40 MG tablet Take 1 tablet (40 mg total) by mouth daily. 90 tablet 3  . polyethylene glycol powder (GLYCOLAX/MIRALAX) 17 GM/SCOOP powder Add 1 scoop (17 gm) of powder to 8 or  more ounces of liquid once daily to treat constipation 507 g 11  . potassium chloride (K-DUR) 10 MEQ tablet Take 20 mEq by mouth daily.    . promethazine (PHENERGAN) 25 MG tablet Take 25 mg by mouth every 6 (six) hours as needed for nausea or vomiting.    Marland Kitchen tenofovir (VIREAD) 300 MG tablet Take 1 tablet (300 mg total) by mouth daily. 30 tablet 1  . tiZANidine (ZANAFLEX) 4 MG tablet TAKE 1 TABLET BY MOUTH AT BEDTIME AS NEEDED FOR MUSCLE SPASM 30 tablet 0   No facility-administered medications prior to visit.     No Known Allergies  Social History   Tobacco Use  . Smoking status: Current Every Day Smoker    Packs/day: 1.00    Types: Cigarettes  . Smokeless tobacco: Never Used  Substance Use Topics  . Alcohol use: Not Currently  . Drug use: Not Currently    Family History  Family history unknown: Yes    Objective:   Vitals:   03/29/19 0937  BP: 120/78  Pulse: 83  Weight: 170 lb (77.1 kg)   Body mass index is 22.43 kg/m.  Physical Exam Constitutional:      Appearance: Normal appearance. He is not ill-appearing.  Eyes:     General: No scleral icterus. Cardiovascular:     Rate and Rhythm: Normal rate and regular rhythm.     Pulses: Normal pulses.     Heart sounds: Normal heart sounds.  Pulmonary:     Effort: Pulmonary effort is normal.     Breath sounds: Normal breath sounds.  Abdominal:     General: Bowel sounds are normal. There is no distension.     Palpations: Abdomen is soft. There is no fluid wave, hepatomegaly or splenomegaly.     Tenderness: There is no abdominal tenderness.  Musculoskeletal:        General: No swelling.     Right lower leg: No edema.     Left lower leg: No edema.  Skin:    Capillary Refill: Capillary refill takes less than 2 seconds.     Coloration: Skin is not jaundiced.     Findings: No rash.  Neurological:     Mental Status: He is alert and oriented to person, place, and time.  Psychiatric:        Behavior: Behavior normal.         Judgment: Judgment normal.     Lab Results: Lab Results  Component Value Date   WBC 4.2 03/20/2019   HGB 13.3 03/20/2019   HCT 37.2 (L) 03/20/2019   MCV 106 (H) 03/20/2019   PLT 132 (L) 03/20/2019    Lab Results  Component Value Date   CREATININE 1.08 03/20/2019   BUN 15 03/20/2019   NA 142 03/20/2019   K 4.4 03/20/2019   CL 109 (H) 03/20/2019   CO2 21 03/20/2019  Lab Results  Component Value Date   ALT 19 03/20/2019   AST 34 03/20/2019   ALKPHOS 145 (H) 03/20/2019   BILITOT 0.4 03/20/2019     Assessment & Plan:   Problem List Items Addressed This Visit      Unprioritized   Chronic hepatitis B virus infection ([place])    New patient with a history of hepatitis b surface antigen as reviewed in [place] 2019.  No labs since in our system - there was some labs faxed from a GI team (unclear where) that revealed a negative surface antigen recently.  His HBV DNA at the time of treatment was 5 million copies/mL --> this reduced to 2600 at the last office visit in July 2019. At the time his treatment began his FibroScan indicated possible nodular appearance of liver, PLT count 103K. While he does not report a history of cirrhosis, I suspect that with these findings he may have cirrhosis. Clinical exam negative for ascites/LE edema.   There is a chance he could have cleared his infection on Viread previously - will check labs to see if this is the case:  Will check Hep B surface antigen, Hepatitis e antigen, Hepatitis surface Ab, Hepatitis C Ab, Hepatitis A Ab,   We discussed natural progression of hepatitis b and the potential if not monitored appropriately to progress to cirrhosis and liver failure. I asked Terry Boyle to please ensure all household family members are tested for active infection with surface antigen and immunity with surface antibody; recommend vaccinations to family members if indicated. He is not curently sexually active with any partners - counseled on  presumed sexual transmission risk if still viremic.   Will repeat elastography and liver ultrasound with concerning features of cirrhosis from previous evaluation with [facility].   He will return in 3 weeks via telephone for review of labs and ultrasound results.        Other Visit Diagnoses    Chronic viral hepatitis B without delta agent and without coma ([place])    -  Primary   Relevant Orders   Hepatitis B surface antibody,qualitative   Hepatitis B surface antigen   Hepatitis B DNA, ultraquantitative, PCR   Comp Met (CMET)   CBC   HIV Antibody (routine testing w rflx)   Hepatitis A Ab, Total   US ABDOMEN RUQ W/ELASTOGRAPHY   Hepatitis C antibody     There is no information in his chart that validates HIV infection - will for certainty repeat HIV Ab (negative in 2019 and sounds to have no ongoing risk factors to acquire this since then) and remove from chart.    Janene Madeira, MSN, NP-C [facility] for [facility] [place] Pager: [phone] Office: [phone]  03/29/19  2:25 PM

## 2019-03-29 NOTE — Patient Instructions (Addendum)
Nice to meet you today!  Will get you set up for an ultrasound of your liver.   Please stop by the lab on your way out.   For your COVID Vaccine -  There are 3 options out currently to consider:  1. Pfizer - 2 doses 3 weeks apart, 95% protective  2. Moderna - 2 doses 4 weeks apart, 95% protective  3. Johnsen & Johnsen - 1 dose, 70% protective   To Schedule at [facility]:  To book an appointment, go to [email or web address]   OR  For the [facility] [employer] [facility] [facility]:  Call [phone] to schedule your appointment     OR  Text "[email or web address]" to [phone] to get a link to schedule through the [facility] Department.   Please schedule a phone visit in 3 weeks to review lab work and ultrasound results.

## 2019-03-29 NOTE — Assessment & Plan Note (Signed)
There is absolutely no information in his chart that validates this - last HIV screen in 2019 is negative. Will repeat this today but plan to remove from medical history.

## 2019-03-29 NOTE — Assessment & Plan Note (Signed)
New patient with a history of hepatitis b surface antigen as reviewed in [place] 2019.  No labs since in our system - there was some labs faxed from a GI team (unclear where) that revealed a negative surface antigen recently.  His HBV DNA at the time of treatment was 5 million copies/mL --> this reduced to 2600 at the last office visit in July 2019. At the time his treatment began his FibroScan indicated possible nodular appearance of liver, PLT count 103K. While he does not report a history of cirrhosis, I suspect that with these findings he may have cirrhosis. Clinical exam negative for ascites/LE edema.   There is a chance he could have cleared his infection on Viread previously - will check labs to see if this is the case:  Will check Hep B surface antigen, Hepatitis e antigen, Hepatitis surface Ab, Hepatitis C Ab, Hepatitis A Ab,   We discussed natural progression of hepatitis b and the potential if not monitored appropriately to progress to cirrhosis and liver failure. I asked Beren Linnemann to please ensure all household family members are tested for active infection with surface antigen and immunity with surface antibody; recommend vaccinations to family members if indicated. He is not curently sexually active with any partners - counseled on presumed sexual transmission risk if still viremic.   Will repeat elastography and liver ultrasound with concerning features of cirrhosis from previous evaluation with [facility].   He will return in 3 weeks via telephone for review of labs and ultrasound results.

## 2019-04-02 LAB — COMPREHENSIVE METABOLIC PANEL
AG Ratio: 1.3 (calc) (ref 1.0–2.5)
ALT: 18 U/L (ref 9–46)
AST: 28 U/L (ref 10–35)
Albumin: 4.1 g/dL (ref 3.6–5.1)
Alkaline phosphatase (APISO): 117 U/L (ref 35–144)
BUN: 14 mg/dL (ref 7–25)
CO2: 25 mmol/L (ref 20–32)
Calcium: 9.3 mg/dL (ref 8.6–10.3)
Chloride: 110 mmol/L (ref 98–110)
Creat: 1.03 mg/dL (ref 0.70–1.25)
Globulin: 3.2 g/dL (calc) (ref 1.9–3.7)
Glucose, Bld: 99 mg/dL (ref 65–99)
Potassium: 4.3 mmol/L (ref 3.5–5.3)
Sodium: 142 mmol/L (ref 135–146)
Total Bilirubin: 0.6 mg/dL (ref 0.2–1.2)
Total Protein: 7.3 g/dL (ref 6.1–8.1)

## 2019-04-02 LAB — HEPATITIS C ANTIBODY
Hepatitis C Ab: NONREACTIVE
SIGNAL TO CUT-OFF: 0.08 (ref <1.00)

## 2019-04-02 LAB — HEPATITIS B SURFACE ANTIBODY,QUALITATIVE: Hep B S Ab: NONREACTIVE

## 2019-04-02 LAB — HEPATITIS B DNA, ULTRAQUANTITATIVE, PCR
Hepatitis B DNA (Calc): <1 Log IU/mL — ABNORMAL HIGH
Hepatitis B DNA: <10 IU/mL — ABNORMAL HIGH

## 2019-04-02 LAB — HEPATITIS B SURFACE ANTIGEN: Hepatitis B Surface Ag: REACTIVE — AB

## 2019-04-02 LAB — CBC
HCT: 38.6 % (ref 38.5–50.0)
Hemoglobin: 13.5 g/dL (ref 13.2–17.1)
MCH: 37.1 pg — ABNORMAL HIGH (ref 27.0–33.0)
MCHC: 35 g/dL (ref 32.0–36.0)
MCV: 106 fL — ABNORMAL HIGH (ref 80.0–100.0)
MPV: 12.1 fL (ref 7.5–12.5)
Platelets: 128 10*3/uL — ABNORMAL LOW (ref 140–400)
RBC: 3.64 10*6/uL — ABNORMAL LOW (ref 4.20–5.80)
RDW: 13.9 % (ref 11.0–15.0)
WBC: 4 10*3/uL (ref 3.8–10.8)

## 2019-04-02 LAB — HIV ANTIBODY (ROUTINE TESTING W REFLEX): HIV 1&2 Ab, 4th Generation: NONREACTIVE

## 2019-04-02 LAB — HEPATITIS A ANTIBODY, TOTAL: Hepatitis A AB,Total: NONREACTIVE

## 2019-04-03 ENCOUNTER — Other Ambulatory Visit: Payer: Self-pay

## 2019-04-03 ENCOUNTER — Ambulatory Visit (HOSPITAL_COMMUNITY): Payer: Medicaid Other | Admitting: Physical Therapy

## 2019-04-03 DIAGNOSIS — M545 Low back pain: Secondary | ICD-10-CM | POA: Diagnosis not present

## 2019-04-03 DIAGNOSIS — G8929 Other chronic pain: Secondary | ICD-10-CM

## 2019-04-03 DIAGNOSIS — R29898 Other symptoms and signs involving the musculoskeletal system: Secondary | ICD-10-CM

## 2019-04-03 NOTE — Therapy (Signed)
[place], [place], [address] Phone: [phone]   Fax:  [phone]  Physical Therapy Treatment  Patient Details  Name: Terry Boyle MRN: [id number] Date of Birth: 1955-10-21 Referring Provider (PT): Eunice Blase, MD   Encounter Date: 04/03/2019  PT End of Session - 04/03/19 1322    Visit Number  2    Number of Visits  9    Date for PT Re-Evaluation  04/24/19    Authorization Type  Medicaid    Authorization Time Period  3/22-4/4 3 Visits approved    Authorization - Visit Number  1    Authorization - Number of Visits  3    Progress Note Due on Visit  9    PT Start Time  1005    PT Stop Time  1048    PT Time Calculation (min)  43 min    Activity Tolerance  Patient tolerated treatment well    Behavior During Therapy  [facility] for tasks assessed/performed;Agitated   Agitated due to having to wait for appointment      Past Medical History:  Diagnosis Date  . HIV (human immunodeficiency virus infection) ([place])   . Seizure disorder [facility])     Past Surgical History:  Procedure Laterality Date  . BIOPSY  06/24/2018   Procedure: BIOPSY;  Surgeon: Ronnette Juniper, MD;  Location: [place];  Service: Gastroenterology;;  . CRANIECTOMY Left   . ESOPHAGOGASTRODUODENOSCOPY (EGD) WITH PROPOFOL N/A 06/24/2018   Procedure: ESOPHAGOGASTRODUODENOSCOPY (EGD) WITH PROPOFOL;  Surgeon: Ronnette Juniper, MD;  Location: [place];  Service: Gastroenterology;  Laterality: N/A;    There were no vitals filed for this visit.  Subjective Assessment - 04/03/19 1012    Subjective  Pt states he drank something ([employer]) the other day and his stomach has not been right since.  States it is better today but his back has not changed.  STill the same pain.    Currently in Pain?  Yes    Pain Score  7     Pain Location  Back    Pain Orientation  Lower    Pain Descriptors / Indicators  Aching;Dull                        [facility] Adult PT Treatment/Exercise - 04/03/19 0001      Lumbar Exercises: Stretches   Active Hamstring Stretch  2 reps;30 seconds    Active Hamstring Stretch Limitations  with towel    Single Knee to Chest Stretch  5 reps;10 seconds    Double Knee to Chest Stretch  5 reps;10 seconds    Other Lumbar Stretch Exercise  standing hip excursions 10 reps each      Lumbar Exercises: Prone   Other Prone Lumbar Exercises  POE 4 minutes      Manual Therapy   Manual Therapy  Soft tissue mobilization    Manual therapy comments  completed at EOS seperate from all other skilled inteventions    Soft tissue mobilization  prone with massage gun.  Round tip level 12 8 minutes total Rt lumbar region into glutes             PT Education - 04/03/19 1316    Education Details  reviewed goals and POC moving forward.  Given folder with written HEP instructions and copy of schedule.    Person(s) Educated  Patient    Methods  Explanation;Demonstration;Tactile cues;Verbal cues;Handout  Comprehension  Verbalized understanding;Returned demonstration       PT Short Term Goals - 04/03/19 1018      PT SHORT TERM GOAL #1   Title  Patient will be educated on HEP and report regular compliance with this.    Time  2    Period  Weeks    Status  On-going    Target Date  04/10/19      PT SHORT TERM GOAL #2   Title  Patient will report an overall improvement in symptoms of at least 50% to improve QOL.    Time  2    Period  Weeks    Status  On-going    Target Date  04/10/19        PT Long Term Goals - 04/03/19 1019      PT LONG TERM GOAL #1   Title  Patient will report an overall improvement in symptoms of at least 75% to improve QOL.    Time  4    Period  Weeks    Status  On-going      PT LONG TERM GOAL #2   Title  Patient will demonstrate ability to perform SLS for at least 5 seconds on each LE indicating improved balance and stability.    Time  4    Period   Weeks    Status  On-going      PT LONG TERM GOAL #3   Title  Patient will report ability to stand for at least 30 minutes in order to perform household chores with improved ease.    Time  4    Period  Weeks    Status  On-going      PT LONG TERM GOAL #4   Title  Patient will report ability to walk for at least 30 minutes in order to perform community activity such as grocery shopping with improved ease.    Time  4    Period  Weeks    Status  On-going            Plan - 04/03/19 1319    Clinical Impression Statement  REviewed goals and POC moving foward.  Initated HEP including stretching and lumbar ROM exercises. Pt without any pain or pain behaviors during or after session today.  Pt more favorable to extension vs flexion and overall comfort while prone on elbows.  Completed massage to Rt lumbar region into hip using massage gun with noted relief at end of session.  Pt decreased to 6/10 at end of session.    Personal Factors and Comorbidities  Age;Time since onset of injury/illness/exacerbation;Comorbidity 1    Comorbidities  HIV    Examination-Activity Limitations  Stand;Bed Mobility;Locomotion Level;Bend;Carry;Sit;Squat    Examination-Participation Restrictions  Yard Work;Cleaning;Meal Prep;Community Activity;Driving;Laundry;Shop    Stability/Clinical Decision Making  Stable/Uncomplicated    Rehab Potential  Fair    PT Frequency  2x / week    PT Duration  4 weeks    PT Treatment/Interventions  ADLs/Self Care Home Management;Cryotherapy;Electrical Stimulation;Moist Heat;DME Instruction;Gait training;Stair training;Functional mobility training;Therapeutic activities;Therapeutic exercise;Aquatic Therapy;Balance training;Neuromuscular re-education;Patient/family education;Orthotic Fit/Training;Manual techniques;Passive range of motion;Dry needling;Energy conservation;Taping    PT Next Visit Plan  Continue with lumbar stabilizaiton, manual PRN to lumbar paraspinals and gentle joint  mobilizations as tolerated.    PT Home Exercise Plan  04/03/19:  SKTC, hamstring stretch, hip excursions (for lumbar mobility)    Consulted and Agree with Plan of Care  Patient       Patient will  benefit from skilled therapeutic intervention in order to improve the following deficits and impairments:  Improper body mechanics, Pain, Decreased mobility, Decreased activity tolerance, Decreased endurance, Decreased range of motion, Decreased strength, Hypomobility, Decreased balance  Visit Diagnosis: Chronic bilateral low back pain, unspecified whether sciatica present  Other symptoms and signs involving the musculoskeletal system     Problem List Patient Active Problem List   Diagnosis Date Noted  . Acute blood loss anemia 06/22/2018  . Seizure disorder ([place])   . Pancreatic disease   . Marijuana use 09/01/2017  . Chronic hepatitis B virus infection ([place]) 07/26/2017   Teena Irani, PTA/CLT [phone]  Teena Irani 04/03/2019, 1:24 PM  [place] [address] [place], [place], [address] Phone: [phone]   Fax:  [phone]  Name: Terry Boyle MRN: [id number] Date of Birth: 1955-04-13

## 2019-04-03 NOTE — Patient Instructions (Signed)
Knee to Chest    Lying supine, bend involved knee to chest _5__ times. Repeat with other leg.Do _2 __ times per day. Hold 10 seconds  Supine Hamstring Stretch    Lift one leg, placing hands behind thigh. Gently pull leg toward torso until a stretch is felt. Other leg is bent and back is flat against floor. Repeat with other leg. Repeat _3___ times. Do __2__ sessions per day.  Hold for 30 seconds

## 2019-04-05 ENCOUNTER — Other Ambulatory Visit: Payer: Self-pay

## 2019-04-05 ENCOUNTER — Encounter (HOSPITAL_COMMUNITY): Payer: Self-pay | Admitting: Physical Therapy

## 2019-04-05 ENCOUNTER — Ambulatory Visit (HOSPITAL_COMMUNITY): Payer: Medicaid Other | Admitting: Physical Therapy

## 2019-04-05 DIAGNOSIS — M545 Low back pain, unspecified: Secondary | ICD-10-CM

## 2019-04-05 DIAGNOSIS — G8929 Other chronic pain: Secondary | ICD-10-CM

## 2019-04-05 DIAGNOSIS — R29898 Other symptoms and signs involving the musculoskeletal system: Secondary | ICD-10-CM

## 2019-04-05 NOTE — Therapy (Addendum)
[place], [place], [address] Phone: [phone]   Fax:  [phone]  Physical Therapy Treatment  Patient Details  Name: Terry Boyle MRN: [id number] Date of Birth: [birth year] Referring Provider (PT): Eunice Blase, MD   Encounter Date: 04/05/2019  PT End of Session - 04/05/19 1042    Visit Number  3    Number of Visits  9    Date for PT Re-Evaluation  04/24/19    Authorization Type  Medicaid    Authorization Time Period  3/22-4/4 3 Visits approved    Authorization - Visit Number  2    Authorization - Number of Visits  3    Progress Note Due on Visit  9    PT Start Time  1034    PT Stop Time  1112    PT Time Calculation (min)  38 min    Activity Tolerance  Patient tolerated treatment well    Behavior During Therapy  [facility] for tasks assessed/performed      Past Medical History:  Diagnosis Date  . HIV (human immunodeficiency virus infection) ([place])   . Seizure disorder [facility])     Past Surgical History:  Procedure Laterality Date  . BIOPSY  06/24/2018   Procedure: BIOPSY;  Surgeon: Ronnette Juniper, MD;  Location: [place];  Service: Gastroenterology;;  . CRANIECTOMY Left   . ESOPHAGOGASTRODUODENOSCOPY (EGD) WITH PROPOFOL N/A 06/24/2018   Procedure: ESOPHAGOGASTRODUODENOSCOPY (EGD) WITH PROPOFOL;  Surgeon: Ronnette Juniper, MD;  Location: [place];  Service: Gastroenterology;  Laterality: N/A;    There were no vitals filed for this visit.  Subjective Assessment - 04/05/19 1036    Subjective  Patient reported that he is feeling a little better now.    Currently in Pain?  Yes    Pain Score  6     Pain Location  Back    Pain Orientation  Lower    Pain Descriptors / Indicators  Aching;Dull    Pain Type  Chronic pain    Pain Onset  More than a month ago                       [facility] Adult PT Treatment/Exercise - 04/05/19 0001      Lumbar Exercises: Stretches   Single Knee to Chest Stretch  3  reps;30 seconds    Single Knee to Chest Stretch Limitations  Patient reported improvement with increased hold time    Double Knee to Chest Stretch  3 reps;30 seconds    Prone on Elbows Stretch Limitations  4 minutes      Lumbar Exercises: Standing   Other Standing Lumbar Exercises  Hip excursions x10 each    Other Standing Lumbar Exercises  Pallof press RTB semi-tandem 20x each LE      Manual Therapy   Manual Therapy  Soft tissue mobilization    Manual therapy comments  completed at EOS seperate from all other skilled inteventions    Soft tissue mobilization  prone with massage gun.  Round tip level 12 Rt lumbar region into glutes               PT Short Term Goals - 04/03/19 1018      PT SHORT TERM GOAL #1   Title  Patient will be educated on HEP and report regular compliance with this.    Time  2    Period  Weeks    Status  On-going  Target Date  04/10/19      PT SHORT TERM GOAL #2   Title  Patient will report an overall improvement in symptoms of at least 50% to improve QOL.    Time  2    Period  Weeks    Status  On-going    Target Date  04/10/19        PT Long Term Goals - 04/03/19 1019      PT LONG TERM GOAL #1   Title  Patient will report an overall improvement in symptoms of at least 75% to improve QOL.    Time  4    Period  Weeks    Status  On-going      PT LONG TERM GOAL #2   Title  Patient will demonstrate ability to perform SLS for at least 5 seconds on each LE indicating improved balance and stability.    Time  4    Period  Weeks    Status  On-going      PT LONG TERM GOAL #3   Title  Patient will report ability to stand for at least 30 minutes in order to perform household chores with improved ease.    Time  4    Period  Weeks    Status  On-going      PT LONG TERM GOAL #4   Title  Patient will report ability to walk for at least 30 minutes in order to perform community activity such as grocery shopping with improved ease.    Time  4     Period  Weeks    Status  On-going            Plan - 04/05/19 1117    Clinical Impression Statement  Began by reviewing patient's HEP with patient demonstrating good form with minimal cueing. This session added pallof press with RTB. Patient required frequent cueing for form with this, but did not report any increase in pain. Ended session with STM using massage gun with patient reporting an improvement in symptoms following this. Patient would benefit from continued skilled physical therapy to continue progressing towards functional goals.    Personal Factors and Comorbidities  Age;Time since onset of injury/illness/exacerbation;Comorbidity 1    Comorbidities  HIV    Examination-Activity Limitations  Stand;Bed Mobility;Locomotion Level;Bend;Carry;Sit;Squat    Examination-Participation Restrictions  Yard Work;Cleaning;Meal Prep;Community Activity;Driving;Laundry;Shop    Stability/Clinical Decision Making  Stable/Uncomplicated    Rehab Potential  Fair    PT Frequency  2x / week    PT Duration  4 weeks    PT Treatment/Interventions  ADLs/Self Care Home Management;Cryotherapy;Electrical Stimulation;Moist Heat;DME Instruction;Gait training;Stair training;Functional mobility training;Therapeutic activities;Therapeutic exercise;Aquatic Therapy;Balance training;Neuromuscular re-education;Patient/family education;Orthotic Fit/Training;Manual techniques;Passive range of motion;Dry needling;Energy conservation;Taping    PT Next Visit Plan  Continue with lumbar stabilizaiton, manual PRN to lumbar paraspinals and glutes. Trial bridges next session.    PT Home Exercise Plan  04/03/19:  SKTC, hamstring stretch, hip excursions (for lumbar mobility)    Consulted and Agree with Plan of Care  Patient       Patient will benefit from skilled therapeutic intervention in order to improve the following deficits and impairments:  Improper body mechanics, Pain, Decreased mobility, Decreased activity tolerance,  Decreased endurance, Decreased range of motion, Decreased strength, Hypomobility, Decreased balance  Visit Diagnosis: Chronic bilateral low back pain, unspecified whether sciatica present  Other symptoms and signs involving the musculoskeletal system     Problem List Patient Active Problem List   Diagnosis  Date Noted  . Acute blood loss anemia 06/22/2018  . Seizure disorder ([place])   . Pancreatic disease   . Marijuana use 09/01/2017  . Chronic hepatitis B virus infection ([place]) 07/26/2017   Clarene Critchley PT, DPT 11:19 AM, 04/05/19 [place], [place], [address] Phone: [phone]   Fax:  [phone]  Name: Thearon Gochanour MRN: [id number] Date of Birth: 03/14/1955

## 2019-04-10 ENCOUNTER — Telehealth (HOSPITAL_COMMUNITY): Payer: Self-pay | Admitting: Physical Therapy

## 2019-04-10 ENCOUNTER — Ambulatory Visit (HOSPITAL_COMMUNITY): Payer: Medicaid Other | Admitting: Physical Therapy

## 2019-04-10 NOTE — Telephone Encounter (Signed)
pt cancelled appt today because it is raining and he does not want to come out

## 2019-04-12 ENCOUNTER — Telehealth (HOSPITAL_COMMUNITY): Payer: Self-pay | Admitting: Physical Therapy

## 2019-04-12 ENCOUNTER — Ambulatory Visit (HOSPITAL_COMMUNITY): Payer: Medicaid Other | Admitting: Physical Therapy

## 2019-04-12 NOTE — Telephone Encounter (Signed)
Pt's ride is sick and its too cold to be here

## 2019-04-17 ENCOUNTER — Other Ambulatory Visit: Payer: Self-pay

## 2019-04-17 ENCOUNTER — Encounter (HOSPITAL_COMMUNITY): Payer: Self-pay

## 2019-04-17 ENCOUNTER — Ambulatory Visit (HOSPITAL_COMMUNITY): Payer: Medicaid Other | Attending: Family Medicine

## 2019-04-17 DIAGNOSIS — G8929 Other chronic pain: Secondary | ICD-10-CM | POA: Diagnosis present

## 2019-04-17 DIAGNOSIS — M545 Low back pain: Secondary | ICD-10-CM | POA: Insufficient documentation

## 2019-04-17 DIAGNOSIS — R29898 Other symptoms and signs involving the musculoskeletal system: Secondary | ICD-10-CM | POA: Diagnosis present

## 2019-04-17 NOTE — Patient Instructions (Signed)
Bridging    Slowly raise buttocks from floor, keeping stomach tight. Repeat 10 times per set. Do 2 sets per session. Do 1-2 sessions per day.  http://orth.exer.us/1096   Copyright  VHI. All rights reserved.

## 2019-04-17 NOTE — Therapy (Signed)
[place], [place], [address] Phone: [phone]   Fax:  [phone]  Physical Therapy Treatment  Patient Details  Name: Terry Boyle MRN: [id number] Date of Birth: Apr 04, 1955 Referring Provider (PT): Eunice Blase, MD   Encounter Date: 04/17/2019  PT End of Session - 04/17/19 0920    Visit Number  4    Number of Visits  9    Date for PT Re-Evaluation  04/24/19    Authorization Type  Medicaid    Authorization Time Period  3/22-4/4 3 Visits approved    Authorization - Visit Number  3    Authorization - Number of Visits  3    Progress Note Due on Visit  9    PT Start Time  0912    PT Stop Time  0953    PT Time Calculation (min)  41 min    Activity Tolerance  Patient tolerated treatment well    Behavior During Therapy  [facility] for tasks assessed/performed       Past Medical History:  Diagnosis Date  . HIV (human immunodeficiency virus infection) ([place])   . Seizure disorder [facility])     Past Surgical History:  Procedure Laterality Date  . BIOPSY  06/24/2018   Procedure: BIOPSY;  Surgeon: Ronnette Juniper, MD;  Location: [place];  Service: Gastroenterology;;  . CRANIECTOMY Left   . ESOPHAGOGASTRODUODENOSCOPY (EGD) WITH PROPOFOL N/A 06/24/2018   Procedure: ESOPHAGOGASTRODUODENOSCOPY (EGD) WITH PROPOFOL;  Surgeon: Ronnette Juniper, MD;  Location: [place];  Service: Gastroenterology;  Laterality: N/A;    There were no vitals filed for this visit.  Subjective Assessment - 04/17/19 0925    Subjective  Pt reports pain scale 6/10 Lt hip and LBP.  Reports good tolerance for supine position, states he wakes up on side with discomfort.    Pertinent History  RT THA per patient report    How long can you stand comfortably?  04/17/19:  Reports ability to stand for 5 minutes comfortably (was 15 minutes)    How long can you walk comfortably?  04/17/19:  Reports abiliity to walk to his apt from therapy about 45 minutes (was 15  minutes)                       [facility] Adult PT Treatment/Exercise - 04/17/19 0001      Lumbar Exercises: Stretches   Double Knee to Chest Stretch  3 reps;30 seconds    Prone on Elbows Stretch Limitations  4 minutes      Lumbar Exercises: Standing   Other Standing Lumbar Exercises  SLS Lt 25", Lt 4" max of 3    Other Standing Lumbar Exercises  vector stance 2x 5"; paloff press RTB 15x 4 on foam in semi-tandem stance      Lumbar Exercises: Supine   Bridge  10 reps      Manual Therapy   Manual Therapy  Soft tissue mobilization    Manual therapy comments  completed at EOS seperate from all other skilled inteventions    Soft tissue mobilization  prone with massage gun.  Round tip level 12 Rt lumbar region into glutes               PT Short Term Goals - 04/17/19 0867      PT SHORT TERM GOAL #1   Title  Patient will be educated on HEP and report regular compliance with this.    Baseline  04/17/19:  Reports compliance with HEP daily    Status  Achieved      PT SHORT TERM GOAL #2   Title  Patient will report an overall improvement in symptoms of at least 50% to improve QOL.    Baseline  04/17/19:  Reports improvements by 70%        PT Long Term Goals - 04/17/19 0923      PT LONG TERM GOAL #1   Title  Patient will report an overall improvement in symptoms of at least 75% to improve QOL.    Baseline  04/17/19:  Reports improvements by 70%      PT LONG TERM GOAL #2   Title  Patient will demonstrate ability to perform SLS for at least 5 seconds on each LE indicating improved balance and stability.    Baseline  04/17/19: Lt 25", Rt 4" max    Status  Partially Met      PT LONG TERM GOAL #3   Title  Patient will report ability to stand for at least 30 minutes in order to perform household chores with improved ease.    Baseline  04/17/19:  Reports ability to stand for 5 minutes comfortably    Status  On-going      PT LONG TERM GOAL #4   Title  Patient will  report ability to walk for at least 30 minutes in order to perform community activity such as grocery shopping with improved ease.    Baseline  04/17/19:  Reports abiliity to walk to his apt from therapy about 45 minutes    Status  Achieved            Plan - 04/17/19 1252    Clinical Impression Statement  Reviewed STG/LTGs for Medicaid authorization.  Pt reports compliance wiht HEP daily, improved tolerance for walking and feels 70% improvements overall.  Pt continues to c/o some LBP and hip pain though does report improvements following therex and STM using massage gun to lumbar and gluteal region.  Pt continues to be limited with standing tolerance and balance.  Added bridges this session for gluteal strengthening and lumbar extension with positive response.    Personal Factors and Comorbidities  Age;Time since onset of injury/illness/exacerbation;Comorbidity 1    Comorbidities  HIV    Examination-Activity Limitations  Stand;Bed Mobility;Locomotion Level;Bend;Carry;Sit;Squat    Examination-Participation Restrictions  Yard Work;Cleaning;Meal Prep;Community Activity;Driving;Laundry;Shop    Stability/Clinical Decision Making  Stable/Uncomplicated    Clinical Decision Making  Low    Rehab Potential  Fair    PT Frequency  2x / week    PT Duration  4 weeks    PT Treatment/Interventions  ADLs/Self Care Home Management;Cryotherapy;Electrical Stimulation;Moist Heat;DME Instruction;Gait training;Stair training;Functional mobility training;Therapeutic activities;Therapeutic exercise;Aquatic Therapy;Balance training;Neuromuscular re-education;Patient/family education;Orthotic Fit/Training;Manual techniques;Passive range of motion;Dry needling;Energy conservation;Taping    PT Next Visit Plan  Add 3D hip excursion and standing extension next session.  Continue with lumbar stabilization.  Manual PRN to lumbar paraspinals and glutes.    PT Home Exercise Plan  04/03/19:  SKTC, hamstring stretch, hip  excursions (for lumbar mobility); 04/17/19: bridge       Patient will benefit from skilled therapeutic intervention in order to improve the following deficits and impairments:  Improper body mechanics, Pain, Decreased mobility, Decreased activity tolerance, Decreased endurance, Decreased range of motion, Decreased strength, Hypomobility, Decreased balance  Visit Diagnosis: Chronic bilateral low back pain, unspecified whether sciatica present  Other symptoms and signs involving the musculoskeletal system     Problem List  Patient Active Problem List   Diagnosis Date Noted  . Acute blood loss anemia 06/22/2018  . Seizure disorder ([place])   . Pancreatic disease   . Marijuana use 09/01/2017  . Chronic hepatitis B virus infection ([place]) 07/26/2017   Ihor Austin, LPTA/CLT; CBIS [phone]  Aldona Lento 04/17/2019, 1:01 PM  [place] [address] [place], [place], [address] Phone: [phone]   Fax:  [phone]  Name: Terry Boyle MRN: [id number] Date of Birth: [birth year]

## 2019-04-18 ENCOUNTER — Telehealth: Payer: Self-pay | Admitting: *Deleted

## 2019-04-18 ENCOUNTER — Encounter: Payer: Self-pay | Admitting: Infectious Diseases

## 2019-04-18 ENCOUNTER — Ambulatory Visit (INDEPENDENT_AMBULATORY_CARE_PROVIDER_SITE_OTHER): Payer: Medicaid Other | Admitting: Infectious Diseases

## 2019-04-18 DIAGNOSIS — B181 Chronic viral hepatitis B without delta-agent: Secondary | ICD-10-CM | POA: Diagnosis not present

## 2019-04-18 NOTE — Telephone Encounter (Signed)
Per patient preference, RN faxed lab orders to [facility] ([address]). Phone: [phone]  Fax: [phone] Landis Gandy, RN

## 2019-04-18 NOTE — Progress Notes (Signed)
Patient: Terry Boyle  DOB: [birth year] MRN: [id number] PCP: Antony Blackbird, MD   Referring Provider: Antony Blackbird, MD   Virtual Visit via Telephone Note  I connected with [name] on 04/18/19 at  8:45 AM EDT by telephone and verified that I am speaking with the correct person using two identifiers.   I discussed the limitations, risks, security and privacy concerns of performing an evaluation and management service by telephone and the availability of in person appointments. I also discussed with the patient that there may be a patient responsible charge related to this service. The patient expressed understanding and agreed to proceed.   Patient Location: [place], [place] Provider Location: Home    Patient Active Problem List   Diagnosis Date Noted  . Acute blood loss anemia 06/22/2018  . Seizure disorder ([place])   . Pancreatic disease   . Marijuana use 09/01/2017  . Chronic hepatitis B virus infection ([place]) 07/26/2017     Subjective:  Terry Boyle is a 64 y.o. male here for follow up on chronic hepatitis B labs.   No changes to his medical history noted. Having trouble with appointments given his location in [place] and challenges with transportation.     Past Medical History:  Diagnosis Date  . HIV (human immunodeficiency virus infection) ([place])   . Seizure disorder [facility])     Outpatient Medications Prior to Visit  Medication Sig Dispense Refill  . acetaminophen-codeine (TYLENOL #3) 300-30 MG tablet Take 1 tablet by mouth every 8 (eight) hours as needed for moderate pain. 90 tablet 3  . albuterol (VENTOLIN HFA) 108 (90 Base) MCG/ACT inhaler 2 puffs every 4-6 hours as needed 18 g 4  . amLODipine (NORVASC) 10 MG tablet Take 1 tablet (10 mg total) by mouth daily. To lower blood pressure 30 tablet 2  . amoxicillin (AMOXIL) 500 MG capsule Take 2,000 mg by mouth See admin instructions. Take 4 capsules (2000 mg) by mouth one hour prior to dental appointment    .  hyoscyamine (LEVSIN) 0.125 MG tablet Take 1 tablet (0.125 mg total) by mouth every 6 (six) hours as needed for cramping. 30 tablet 3  . levETIRAcetam (KEPPRA) 1000 MG tablet Take 1 tablet (1,000 mg total) by mouth 2 (two) times daily. 60 tablet 4  . lipase/protease/amylase (CREON) 12000 units CPEP capsule Take 1 capsule (12,000 Units total) by mouth 3 (three) times daily with meals. 270 capsule 4  . ondansetron (ZOFRAN-ODT) 4 MG disintegrating tablet DISSOLVE 1 TABLET IN MOUTH EVERY 8 HOURS AS NEEDED FOR NAUSEA FOR VOMITING 60 tablet 3  . pantoprazole (PROTONIX) 40 MG tablet Take 1 tablet (40 mg total) by mouth daily. 90 tablet 3  . polyethylene glycol powder (GLYCOLAX/MIRALAX) 17 GM/SCOOP powder Add 1 scoop (17 gm) of powder to 8 or more ounces of liquid once daily to treat constipation 507 g 11  . potassium chloride (K-DUR) 10 MEQ tablet Take 20 mEq by mouth daily.    . promethazine (PHENERGAN) 25 MG tablet Take 25 mg by mouth every 6 (six) hours as needed for nausea or vomiting.    Marland Kitchen tenofovir (VIREAD) 300 MG tablet Take 1 tablet (300 mg total) by mouth daily. 30 tablet 1  . tiZANidine (ZANAFLEX) 4 MG tablet TAKE 1 TABLET BY MOUTH AT BEDTIME AS NEEDED FOR MUSCLE SPASM 30 tablet 0   No facility-administered medications prior to visit.     No Known Allergies  Social History   Tobacco Use  . Smoking status: Current  Every Day Smoker    Packs/day: 1.00    Types: Cigarettes  . Smokeless tobacco: Never Used  Substance Use Topics  . Alcohol use: Not Currently  . Drug use: Not Currently    Family History  Family history unknown: Yes    Objective:   There were no vitals filed for this visit. There is no height or weight on file to calculate BMI.  Physical Exam Pulmonary:     Effort: Pulmonary effort is normal.     Comments: No shortness of breath detected in conversation.  Neurological:     Mental Status: He is oriented to person, place, and time.  Psychiatric:        Mood and  Affect: Mood normal.        Behavior: Behavior normal.        Thought Content: Thought content normal.        Judgment: Judgment normal.       Lab Results: Recent Results (from the past 2160 hour(s))  CBC     Status: Abnormal   Collection Time: 03/20/19 10:38 AM  Result Value Ref Range   WBC 4.2 3.4 - 10.8 x10E3/uL   RBC 3.51 (L) 4.14 - 5.80 x10E6/uL   Hemoglobin 13.3 13.0 - 17.7 g/dL   Hematocrit 37.2 (L) 37.5 - 51.0 %   MCV 106 (H) 79 - 97 fL   MCH 37.9 (H) 26.6 - 33.0 pg   MCHC 35.8 (H) 31.5 - 35.7 g/dL   RDW 14.3 11.6 - 15.4 %   Platelets 132 (L) 150 - 450 x10E3/uL  TSH     Status: None   Collection Time: 03/20/19 10:38 AM  Result Value Ref Range   TSH 1.360 0.450 - 4.500 uIU/mL  Lipase     Status: None   Collection Time: 03/20/19 10:38 AM  Result Value Ref Range   Lipase 51 13 - 78 U/L  Comprehensive metabolic panel     Status: Abnormal   Collection Time: 03/20/19 10:38 AM  Result Value Ref Range   Glucose 87 65 - 99 mg/dL   BUN 15 8 - 27 mg/dL   Creatinine, Ser 1.08 0.76 - 1.27 mg/dL   GFR calc non Af Amer 74 >59 mL/min/1.73   GFR calc Af Amer 85 >59 mL/min/1.73   BUN/Creatinine Ratio 14 10 - 24   Sodium 142 134 - 144 mmol/L   Potassium 4.4 3.5 - 5.2 mmol/L   Chloride 109 (H) 96 - 106 mmol/L   CO2 21 20 - 29 mmol/L   Calcium 9.3 8.6 - 10.2 mg/dL   Total Protein 7.1 6.0 - 8.5 g/dL   Albumin 4.2 3.8 - 4.8 g/dL   Globulin, Total 2.9 1.5 - 4.5 g/dL   Albumin/Globulin Ratio 1.4 1.2 - 2.2   Bilirubin Total 0.4 0.0 - 1.2 mg/dL   Alkaline Phosphatase 145 (H) 39 - 117 IU/L   AST 34 0 - 40 IU/L   ALT 19 0 - 44 IU/L  Hepatitis B surface antibody,qualitative     Status: None   Collection Time: 03/29/19 10:14 AM  Result Value Ref Range   Hep B S Ab NON-REACTIVE NON-REACTI  Hepatitis B surface antigen     Status: Abnormal   Collection Time: 03/29/19 10:14 AM  Result Value Ref Range   Hepatitis B Surface Ag REACTIVE (A) NON-REACTI    Comment: Verified by repeat  analysis. .    Confirmation      Comment: HBSAG confirmation by neutralization was not  required to be performed due to a signal/cutoff S/CO ratio result above the threshold required for neutralization.   Hepatitis B DNA, ultraquantitative, PCR     Status: Abnormal   Collection Time: 03/29/19 10:14 AM  Result Value Ref Range   Hepatitis B DNA <10 (H) IU/mL    Comment: HBV DNA Detected   Hepatitis B DNA (Calc) <1.00 (H) Log IU/mL    Comment: HBV DNA Detected . HBV DNA was detected below 10 IU/mL. Viral nucleic acid detected below this level cannot be quantified by the assay. Marland Kitchen Reference Range:                          Not Detected       IU/mL                          Not Detected   Log IU/mL . This test was performed using Real-Time Polymerase Chain Reaction. . Reportable range is 10 IU/mL to 1,000,000,000 IU/mL (1.00 Log IU/mL - 9.00 Log IU/mL). . The analytical performance characteristics of this assay have been determined by [facility], [place], [place].  The modifications have not been cleared or approved by the FDA.  This assay has been validated pursuant to the CLIA regulations and is used for clinical purposes. .   Comp Met (CMET)     Status: None   Collection Time: 03/29/19 10:14 AM  Result Value Ref Range   Glucose, Bld 99 65 - 99 mg/dL    Comment: .            Fasting reference interval .    BUN 14 7 - 25 mg/dL   Creat 1.03 0.70 - 1.25 mg/dL    Comment: For patients >29 years of age, the reference limit for Creatinine is approximately 13% higher for people identified as African-American. .    BUN/Creatinine Ratio NOT APPLICABLE 6 - 22 (calc)   Sodium 142 135 - 146 mmol/L   Potassium 4.3 3.5 - 5.3 mmol/L   Chloride 110 98 - 110 mmol/L   CO2 25 20 - 32 mmol/L   Calcium 9.3 8.6 - 10.3 mg/dL   Total Protein 7.3 6.1 - 8.1 g/dL   Albumin 4.1 3.6 - 5.1 g/dL   Globulin 3.2 1.9 - 3.7 g/dL (calc)   AG Ratio 1.3 1.0 - 2.5 (calc)    Total Bilirubin 0.6 0.2 - 1.2 mg/dL   Alkaline phosphatase (APISO) 117 35 - 144 U/L   AST 28 10 - 35 U/L   ALT 18 9 - 46 U/L  CBC     Status: Abnormal   Collection Time: 03/29/19 10:14 AM  Result Value Ref Range   WBC 4.0 3.8 - 10.8 Thousand/uL   RBC 3.64 (L) 4.20 - 5.80 Million/uL   Hemoglobin 13.5 13.2 - 17.1 g/dL   HCT 38.6 38.5 - 50.0 %   MCV 106.0 (H) 80.0 - 100.0 fL   MCH 37.1 (H) 27.0 - 33.0 pg   MCHC 35.0 32.0 - 36.0 g/dL   RDW 13.9 11.0 - 15.0 %   Platelets 128 (L) 140 - 400 Thousand/uL   MPV 12.1 7.5 - 12.5 fL  HIV Antibody (routine testing w rflx)     Status: None   Collection Time: 03/29/19 10:14 AM  Result Value Ref Range   HIV 1&2 Ab, 4th Generation NON-REACTIVE NON-REACTI    Comment: HIV-1 antigen and HIV-1/HIV-2 antibodies  were not detected. There is no laboratory evidence of HIV infection. Marland Kitchen PLEASE NOTE: This information has been disclosed to you from records whose confidentiality may be protected by state law.  If your state requires such protection, then the state law prohibits you from making any further disclosure of the information without the specific written consent of the person to whom it pertains, or as otherwise permitted by law. A general authorization for the release of medical or other information is NOT sufficient for this purpose. . For additional information please refer to http://education.questdiagnostics.com/faq/FAQ106 (This link is being provided for informational/ educational purposes only.) . Marland Kitchen The performance of this assay has not been clinically validated in patients less than 20 years old. .   Hepatitis A Ab, Total     Status: None   Collection Time: 03/29/19 10:14 AM  Result Value Ref Range   Hepatitis A AB,Total NON-REACTIVE NON-REACTI    Comment: . For additional information, please refer to  http://education.questdiagnostics.com/faq/FAQ202  (This link is being provided for informational/ educational purposes  only.) .   Hepatitis C antibody     Status: None   Collection Time: 03/29/19 10:14 AM  Result Value Ref Range   Hepatitis C Ab NON-REACTIVE NON-REACTI   SIGNAL TO CUT-OFF 0.08 <1.00    Comment: . HCV antibody was non-reactive. There is no laboratory  evidence of HCV infection. . In most cases, no further action is required. However, if recent HCV exposure is suspected, a test for HCV RNA (test code [phone]) is suggested. . For additional information please refer to http://education.questdiagnostics.com/faq/FAQ22v1 (This link is being provided for informational/ educational purposes only.) .      Assessment & Plan:   Problem List Items Addressed This Visit      Unprioritized   Chronic hepatitis B virus infection ([place]) - Primary    Patient with positive surface antigen, DNA < 10 and normal ALT. Appears he has chronic inactive infection. Will continue to monitor off medication for now and repeat pertinent labs in 4 months at local [facility].  Will try to get him to have RUQ elastography vs ultrasound at [facility] to help with fibrosis staging and cirrhosis/HCC screening.   I will arrange a telephone visit follow up in August after all results are available. He requested a letter to be mailed detailing his appointment information - I verified his address today and will mail as requested.       Relevant Orders   US ABDOMEN RUQ W/ELASTOGRAPHY     Follow Up Instructions: Labs to be drawn Aug 2021 at local [place] (orders faxed today).  RUQ Elastography to be completed at [facility] if able. Otherwise we can do RUQ ultrasound alone.  RTC with telephone visit in 4 months to review results.     I discussed the assessment and treatment plan with the patient. The patient was provided an opportunity to ask questions and all were answered. The patient agreed with the plan and demonstrated an understanding of the instructions.   The patient was advised to call back or seek an  in-person evaluation if the symptoms worsen or if the condition fails to improve as anticipated.  I provided 12 minutes of non-face-to-face time during this encounter.   Janene Madeira, MSN, NP-C [facility] for Infectious Disease [email or web address] Pager: [phone] Office: [phone] [facility] Main Line: [place], MSN, NP-C [place] for Infectious Disease [facility] Pager:  [phone] Office: [phone]  04/18/19  10:19 AM

## 2019-04-18 NOTE — Assessment & Plan Note (Signed)
Patient with positive surface antigen, DNA < 10 and normal ALT. Appears he has chronic inactive infection. Will continue to monitor off medication for now and repeat pertinent labs in 4 months at local [facility].  Will try to get him to have RUQ elastography vs ultrasound at [facility] to help with fibrosis staging and cirrhosis/HCC screening.   I will arrange a telephone visit follow up in August after all results are available. He requested a letter to be mailed detailing his appointment information - I verified his address today and will mail as requested.

## 2019-04-19 ENCOUNTER — Ambulatory Visit (HOSPITAL_COMMUNITY): Payer: Medicaid Other

## 2019-04-20 ENCOUNTER — Other Ambulatory Visit: Payer: Self-pay | Admitting: Family Medicine

## 2019-04-23 ENCOUNTER — Telehealth (HOSPITAL_COMMUNITY): Payer: Self-pay | Admitting: Physical Therapy

## 2019-04-23 ENCOUNTER — Ambulatory Visit (HOSPITAL_COMMUNITY): Payer: Medicaid Other | Admitting: Physical Therapy

## 2019-04-23 ENCOUNTER — Encounter (HOSPITAL_COMMUNITY): Payer: Medicaid Other | Admitting: Physical Therapy

## 2019-04-23 NOTE — Telephone Encounter (Signed)
Pt did not show for appointment.  Called preferred number and went straight to VM and VM was full.  Called Cell number and spoke to patient who states he thought his appointment was another day.  Brought to his attention that the call reminders may be going to the preferred number instead of his cell phone.   Reminded patient of next scheduled appointment on Friday at 9:45 in which he replied he would be here.   Teena Irani, PTA/CLT [phone]

## 2019-04-24 ENCOUNTER — Ambulatory Visit: Payer: Medicaid Other | Admitting: Family Medicine

## 2019-04-26 ENCOUNTER — Ambulatory Visit (HOSPITAL_COMMUNITY): Payer: Medicaid Other | Admitting: Physical Therapy

## 2019-04-26 ENCOUNTER — Telehealth (HOSPITAL_COMMUNITY): Payer: Self-pay | Admitting: Physical Therapy

## 2019-04-26 NOTE — Telephone Encounter (Signed)
Called regarding patient not showing up for appointment this date, however, patient did not answer. Patient does not have any further appointments scheduled at this time.   Clarene Critchley PT, DPT 4:17 PM, 04/26/19 [phone]

## 2019-04-26 NOTE — Progress Notes (Signed)
Patient did not show for appointment.   

## 2019-04-29 ENCOUNTER — Ambulatory Visit (HOSPITAL_BASED_OUTPATIENT_CLINIC_OR_DEPARTMENT_OTHER): Payer: Medicaid Other | Admitting: Family

## 2019-04-29 DIAGNOSIS — Z5329 Procedure and treatment not carried out because of patient's decision for other reasons: Secondary | ICD-10-CM

## 2019-05-02 ENCOUNTER — Ambulatory Visit (HOSPITAL_COMMUNITY): Payer: Medicaid Other

## 2019-05-15 ENCOUNTER — Other Ambulatory Visit: Payer: Self-pay

## 2019-05-15 ENCOUNTER — Ambulatory Visit (HOSPITAL_COMMUNITY)
Admission: RE | Admit: 2019-05-15 | Discharge: 2019-05-15 | Disposition: A | Discharge status: Home / Self Care | Payer: Medicaid Other | Source: Ambulatory Visit | Attending: Infectious Diseases | Admitting: Infectious Diseases

## 2019-05-15 DIAGNOSIS — B181 Chronic viral hepatitis B without delta-agent: Secondary | ICD-10-CM | POA: Insufficient documentation

## 2019-05-16 ENCOUNTER — Ambulatory Visit (HOSPITAL_COMMUNITY): Payer: Medicaid Other

## 2019-05-22 ENCOUNTER — Telehealth: Payer: Self-pay | Admitting: *Deleted

## 2019-05-22 NOTE — Telephone Encounter (Signed)
Pt calling to question if any of his medications would interfere with Covid vaccine. Questions answered to pts satisfaction. Advised to CB if any additional questions arise.

## 2019-06-19 ENCOUNTER — Telehealth (HOSPITAL_COMMUNITY): Payer: Self-pay

## 2019-06-26 ENCOUNTER — Other Ambulatory Visit (HOSPITAL_COMMUNITY): Payer: Medicaid Other

## 2019-06-26 DIAGNOSIS — K7469 Other cirrhosis of liver: Secondary | ICD-10-CM | POA: Insufficient documentation

## 2019-06-26 DIAGNOSIS — F1920 Other psychoactive substance dependence, uncomplicated: Secondary | ICD-10-CM | POA: Insufficient documentation

## 2019-06-26 DIAGNOSIS — K859 Acute pancreatitis without necrosis or infection, unspecified: Secondary | ICD-10-CM | POA: Insufficient documentation

## 2019-06-27 DIAGNOSIS — K869 Disease of pancreas, unspecified: Secondary | ICD-10-CM

## 2019-07-04 ENCOUNTER — Ambulatory Visit (HOSPITAL_COMMUNITY): Payer: Medicaid Other

## 2019-07-21 ENCOUNTER — Other Ambulatory Visit: Payer: Self-pay | Admitting: Family Medicine

## 2019-07-21 DIAGNOSIS — I10 Essential (primary) hypertension: Secondary | ICD-10-CM

## 2019-07-21 NOTE — Telephone Encounter (Signed)
Requested Prescriptions  Pending Prescriptions Disp Refills  . amLODipine (NORVASC) 10 MG tablet [Pharmacy Med Name: amLODIPine Besylate 10 MG Oral Tablet] 30 tablet 0    Sig: Take 1 tablet by mouth once daily     Cardiovascular:  Calcium Channel Blockers Passed - 07/21/2019 11:28 AM      Passed - Last BP in normal range    BP Readings from Last 1 Encounters:  03/29/19 120/78         Passed - Valid encounter within last 6 months    Recent Outpatient Visits          2 months ago Erroneous encounter - disregard   [place], [place], NP   4 months ago Seizure disorder [facility])   [place] Fulp, [place], MD   1 year ago Abdominal pain, chronic, generalized   [place] [facility] [place], [place], MD

## 2019-07-29 ENCOUNTER — Telehealth: Payer: Self-pay | Admitting: Family Medicine

## 2019-07-29 ENCOUNTER — Telehealth: Payer: Self-pay

## 2019-07-29 NOTE — Telephone Encounter (Signed)
Called back / message was provided to pt / verbalized understanding

## 2019-07-29 NOTE — Telephone Encounter (Signed)
Received message from [facility] patient coordinator requesting prior auth for pt scheduled MRI at [facility] on 07/31/2019 ordered by pt PCP. Called [employer] to obtain approval. Spoke with Vivien Rossetti  Unable to obtain it at this time due to required clinical office vissit notes. Made Lead Physician Dr Margarita Rana aware / advised to schedule an office visit with a provider to discuss plan of care as pt had a CT scan when seen in the Hospital on 06/25/2019.  Called pt twice today / Significant other( [name] Dillard  DPR in file )answered call both times/ Made aware of MRI  status and the need to have an office visit as suggested by Lead Physician.  Appointment made for 08/15/2019 .Stressed the importance to make Mr Duchemin aware and if any questions or concerns to call back. Name and office nr provided.

## 2019-07-29 NOTE — Telephone Encounter (Signed)
Please f/u. It looks like Eyvonne Mechanic tried to call the patient.   Copied from [place] [phone]. Topic: General - Call Back - No Documentation >> Jul 29, 2019  1:34 PM Leonides Schanz, Ja-Kwan wrote: Reason for CRM: Pt stated that he had a missed call from the office but was unsure of the reason for the call. Pt requests call back

## 2019-07-30 ENCOUNTER — Other Ambulatory Visit: Payer: Self-pay | Admitting: Family Medicine

## 2019-07-30 NOTE — Telephone Encounter (Signed)
Requested medication (s) are due for refill today: yes  Requested medication (s) are on the active medication list:yes  Last refill:  06/29/2019  Future visit scheduled: yes  Notes to clinic:  refill cannot be delegated    Requested Prescriptions  Pending Prescriptions Disp Refills   levETIRAcetam (KEPPRA) 1000 MG tablet [Pharmacy Med Name: levETIRAcetam 1000 MG Oral Tablet] 60 tablet 0    Sig: Take 1 tablet by mouth twice daily      Not Delegated - Neurology:  Anticonvulsants Failed - 07/30/2019 10:52 AM      Failed - This refill cannot be delegated      Failed - PLT in normal range and within 360 days    Platelets  Date Value Ref Range Status  03/29/2019 128 (L) 140 - 400 Thousand/uL Final  03/20/2019 132 (L) 150 - 450 x10E3/uL Final          Passed - HCT in normal range and within 360 days    HCT  Date Value Ref Range Status  03/29/2019 38.6 38 - 50 % Final   Hematocrit  Date Value Ref Range Status  03/20/2019 37.2 (L) 37.5 - 51.0 % Final          Passed - HGB in normal range and within 360 days    Hemoglobin  Date Value Ref Range Status  03/29/2019 13.5 13.2 - 17.1 g/dL Final  03/20/2019 13.3 13.0 - 17.7 g/dL Final          Passed - WBC in normal range and within 360 days    WBC  Date Value Ref Range Status  03/29/2019 4.0 3.8 - 10.8 Thousand/uL Final          Passed - Valid encounter within last 12 months    Recent Outpatient Visits           3 months ago Erroneous encounter - disregard   [place], [place], NP   4 months ago Seizure disorder [facility])   [place] Fulp, [place], MD   1 year ago Abdominal pain, chronic, generalized   [place], [place], MD       Future Appointments             In 2 weeks [place], Dionne Bucy, PA-C [place]              tiZANidine (ZANAFLEX) 4 MG tablet [Pharmacy Med Name:  tiZANidine HCl 4 MG Oral Tablet] 30 tablet 0    Sig: TAKE 1 TABLET BY MOUTH AT BEDTIME AS NEEDED FOR MUSCLE SPASM      Not Delegated - Cardiovascular:  Alpha-2 Agonists - tizanidine Failed - 07/30/2019 10:52 AM      Failed - This refill cannot be delegated      Passed - Valid encounter within last 6 months    Recent Outpatient Visits           3 months ago Erroneous encounter - disregard   [place], [place], NP   4 months ago Seizure disorder [facility])   [place] Fulp, [place], MD   1 year ago Abdominal pain, chronic, generalized   [place], [place], MD       Future Appointments             In 2 weeks [place] Donovan,  PA-C [place]              tenofovir (VIREAD) 300 MG tablet [Pharmacy Med Name: Tenofovir Disoproxil Fumarate 300 MG Oral Tablet] 30 tablet 0    Sig: Take 1 tablet by mouth once daily      Off-Protocol Failed - 07/30/2019 10:52 AM      Failed - Medication not assigned to a protocol, review manually.      Passed - Valid encounter within last 12 months    Recent Outpatient Visits           3 months ago Erroneous encounter - disregard   [place], [place], NP   4 months ago Seizure disorder [facility])   [place] Fulp, [place], MD   1 year ago Abdominal pain, chronic, generalized   [place], MD       Future Appointments             In 2 weeks [place], Dionne Bucy, PA-C [place]

## 2019-07-31 ENCOUNTER — Ambulatory Visit (HOSPITAL_COMMUNITY): Payer: Medicaid Other

## 2019-07-31 ENCOUNTER — Other Ambulatory Visit: Payer: Self-pay | Admitting: Internal Medicine

## 2019-08-02 ENCOUNTER — Other Ambulatory Visit: Payer: Self-pay | Admitting: Family Medicine

## 2019-08-02 NOTE — Telephone Encounter (Signed)
Requested medication (s) are due for refill today: yes Requested medication (s) are on the active medication list: yes  Last refill:  06/29/2019  Future visit scheduled: yes  Notes to clinic:  Patient has appointment scheduled on 08/15/2019 Review for short supply   Requested Prescriptions  Pending Prescriptions Disp Refills   levETIRAcetam (KEPPRA) 1000 MG tablet [Pharmacy Med Name: levETIRAcetam 1000 MG Oral Tablet] 60 tablet 0    Sig: Take 1 tablet by mouth twice daily      Not Delegated - Neurology:  Anticonvulsants Failed - 08/02/2019  3:11 PM      Failed - This refill cannot be delegated      Failed - PLT in normal range and within 360 days    Platelets  Date Value Ref Range Status  03/29/2019 128 (L) 140 - 400 Thousand/uL Final  03/20/2019 132 (L) 150 - 450 x10E3/uL Final          Passed - HCT in normal range and within 360 days    HCT  Date Value Ref Range Status  03/29/2019 38.6 38 - 50 % Final   Hematocrit  Date Value Ref Range Status  03/20/2019 37.2 (L) 37.5 - 51.0 % Final          Passed - HGB in normal range and within 360 days    Hemoglobin  Date Value Ref Range Status  03/29/2019 13.5 13.2 - 17.1 g/dL Final  03/20/2019 13.3 13.0 - 17.7 g/dL Final          Passed - WBC in normal range and within 360 days    WBC  Date Value Ref Range Status  03/29/2019 4.0 3.8 - 10.8 Thousand/uL Final          Passed - Valid encounter within last 12 months    Recent Outpatient Visits           3 months ago Erroneous encounter - disregard   [place], [place], NP   4 months ago Seizure disorder [facility])   [place] Fulp, [place], MD   1 year ago Abdominal pain, chronic, generalized   [place], MD       Future Appointments             In 1 week Charlott Rakes, MD [place]

## 2019-08-05 ENCOUNTER — Telehealth: Payer: Self-pay | Admitting: *Deleted

## 2019-08-05 NOTE — Telephone Encounter (Signed)
Patient left message in triage stating he is out of medication and needed to speak with a provider before getting additional refills.  RN attempted to return call, but it went to voicemail. RN asked patient to please call back. Patient was seen at [facility] for hepatitis B, but per chart, was being watched off medication for the time being.  Will forward to provider for advice. Landis Gandy, RN

## 2019-08-05 NOTE — Telephone Encounter (Signed)
I do not see where we have him on any medication for hep b currently - last Kurtis and I spoke was prior to his elastography and blood work - I have not been able to get in touch with him.   As it appears from his most recent lab work he does not appear to need medication, only monitoring. I would recommend he come back for blood work after his MRI in August so we can discuss further (or we can arrange orders to be sent to [facility] in [place] for him and do virtual follow up).

## 2019-08-06 NOTE — Telephone Encounter (Signed)
Patient calling again for instructions on medications.   I explained he was not currently being prescribed medications for his Hep B and was only being monitored for now.  He was happy to know he did not need medication . I reminded him of his appointment for MRI and gave date, time and instructions for procedure.   He will need a follow up visit but there were none available within a two week follow up.    Patient was advised he would receive a return call related to a possible video appointment and he was fine with this type of visit.    Laverle Patter, RN

## 2019-08-13 ENCOUNTER — Other Ambulatory Visit: Payer: Self-pay | Admitting: Family Medicine

## 2019-08-15 ENCOUNTER — Encounter: Payer: Self-pay | Admitting: Family Medicine

## 2019-08-15 ENCOUNTER — Ambulatory Visit: Payer: Medicaid Other | Admitting: Physician Assistant

## 2019-08-15 ENCOUNTER — Other Ambulatory Visit: Payer: Self-pay

## 2019-08-15 ENCOUNTER — Ambulatory Visit: Payer: Medicaid Other | Attending: Family Medicine | Admitting: Family Medicine

## 2019-08-15 VITALS — BP 98/62 | HR 64 | Ht 73.0 in | Wt 161.0 lb

## 2019-08-15 DIAGNOSIS — I10 Essential (primary) hypertension: Secondary | ICD-10-CM | POA: Diagnosis not present

## 2019-08-15 DIAGNOSIS — R110 Nausea: Secondary | ICD-10-CM

## 2019-08-15 DIAGNOSIS — Z720 Tobacco use: Secondary | ICD-10-CM | POA: Diagnosis not present

## 2019-08-15 DIAGNOSIS — K869 Disease of pancreas, unspecified: Secondary | ICD-10-CM | POA: Diagnosis not present

## 2019-08-15 DIAGNOSIS — Z8719 Personal history of other diseases of the digestive system: Secondary | ICD-10-CM

## 2019-08-15 MED ORDER — LEVETIRACETAM 1000 MG PO TABS: 1000.0000 mg | ORAL_TABLET | Freq: Two times a day (BID) | ORAL | 3 refills | Status: DC

## 2019-08-15 MED ORDER — AMLODIPINE BESYLATE 10 MG PO TABS: 10.0000 mg | ORAL_TABLET | Freq: Every day | ORAL | 3 refills | Status: DC

## 2019-08-15 MED ORDER — IBUPROFEN 600 MG PO TABS: 600.0000 mg | ORAL_TABLET | Freq: Three times a day (TID) | ORAL | 1 refills | Status: DC | PRN

## 2019-08-15 MED ORDER — PANTOPRAZOLE SODIUM 40 MG PO TBEC: 40.0000 mg | DELAYED_RELEASE_TABLET | Freq: Every day | ORAL | 3 refills | Status: DC

## 2019-08-15 MED ORDER — POTASSIUM CHLORIDE ER 10 MEQ PO TBCR: 10.0000 meq | EXTENDED_RELEASE_TABLET | Freq: Every day | ORAL | 3 refills | Status: DC

## 2019-08-15 MED ORDER — ONDANSETRON 4 MG PO TBDP: ORAL_TABLET | ORAL | 3 refills | Status: DC

## 2019-08-15 NOTE — Progress Notes (Signed)
Needs refill on all medications.

## 2019-08-15 NOTE — Progress Notes (Signed)
Subjective:  Patient ID: Terry Boyle, male    DOB: Nov 15, 1955  Age: 63 y.o. MRN: [id number]  CC: Hypertension   HPI Terry Boyle is a 64 year old male patient of Dr.Fulp with a history of hypertension, chronic hepatitis B, seizures, chronic nausea who presents today for chronic disease management. In 06/2019 he was hospitalized for acute pancreatitis at [facility].  Today he states he feels fine but does have intermittent abdominal pain for which he uses ibuprofen and also needs Zofran for nausea. Hepatitis B is managed by infectious disease and he is currently on antiviral medications.  Denies recent seizures and endorses compliance with Keppra.  Also requests refill of his chronic antihypertensive.  He has no additional concerns today. Past Medical History:  Diagnosis Date  . HIV (human immunodeficiency virus infection) ([place])   . Seizure disorder [facility])     Past Surgical History:  Procedure Laterality Date  . BIOPSY  06/24/2018   Procedure: BIOPSY;  Surgeon: Ronnette Juniper, MD;  Location: [facility];  Service: Gastroenterology;;  . CRANIECTOMY Left   . ESOPHAGOGASTRODUODENOSCOPY (EGD) WITH PROPOFOL N/A 06/24/2018   Procedure: ESOPHAGOGASTRODUODENOSCOPY (EGD) WITH PROPOFOL;  Surgeon: Ronnette Juniper, MD;  Location: [facility];  Service: Gastroenterology;  Laterality: N/A;    Family History  Family history unknown: Yes    No Known Allergies  Outpatient Medications Prior to Visit  Medication Sig Dispense Refill  . acetaminophen-codeine (TYLENOL #3) 300-30 MG tablet Take 1 tablet by mouth every 8 (eight) hours as needed for moderate pain. 90 tablet 3  . albuterol (VENTOLIN HFA) 108 (90 Base) MCG/ACT inhaler 2 puffs every 4-6 hours as needed 18 g 4  . hyoscyamine (LEVSIN) 0.125 MG tablet Take 1 tablet (0.125 mg total) by mouth every 6 (six) hours as needed for cramping. 30 tablet 3  . lipase/protease/amylase (CREON) 12000 units CPEP capsule Take 1 capsule (12,000 Units total)  by mouth 3 (three) times daily with meals. 270 capsule 4  . polyethylene glycol powder (GLYCOLAX/MIRALAX) 17 GM/SCOOP powder Add 1 scoop (17 gm) of powder to 8 or more ounces of liquid once daily to treat constipation 507 g 11  . tenofovir (VIREAD) 300 MG tablet Take 1 tablet (300 mg total) by mouth daily. 30 tablet 1  . tiZANidine (ZANAFLEX) 4 MG tablet TAKE 1 TABLET BY MOUTH AT BEDTIME AS NEEDED FOR MUSCLE SPASM 30 tablet 2  . amLODipine (NORVASC) 10 MG tablet Take 1 tablet by mouth once daily 30 tablet 0  . levETIRAcetam (KEPPRA) 1000 MG tablet Take 1 tablet by mouth twice daily 60 tablet 0  . ondansetron (ZOFRAN-ODT) 4 MG disintegrating tablet DISSOLVE 1 TABLET IN MOUTH EVERY 8 HOURS AS NEEDED FOR NAUSEA FOR VOMITING 60 tablet 3  . pantoprazole (PROTONIX) 40 MG tablet Take 1 tablet (40 mg total) by mouth daily. 90 tablet 3  . potassium chloride (K-DUR) 10 MEQ tablet Take 20 mEq by mouth daily.    Marland Kitchen amoxicillin (AMOXIL) 500 MG capsule Take 2,000 mg by mouth See admin instructions. Take 4 capsules (2000 mg) by mouth one hour prior to dental appointment (Patient not taking: Reported on 08/15/2019)    . promethazine (PHENERGAN) 25 MG tablet Take 25 mg by mouth every 6 (six) hours as needed for nausea or vomiting. (Patient not taking: Reported on 08/15/2019)     No facility-administered medications prior to visit.     ROS Review of Systems  Constitutional: Negative for activity change and appetite change.  HENT: Negative for sinus pressure and  sore throat.   Eyes: Negative for visual disturbance.  Respiratory: Negative for cough, chest tightness and shortness of breath.   Cardiovascular: Negative for chest pain and leg swelling.  Gastrointestinal: Negative for abdominal distention, abdominal pain, constipation and diarrhea.  Endocrine: Negative.   Genitourinary: Negative for dysuria.  Musculoskeletal: Negative for joint swelling and myalgias.  Skin: Negative for rash.  Allergic/Immunologic:  Negative.   Neurological: Negative for weakness, light-headedness and numbness.  Psychiatric/Behavioral: Negative for dysphoric mood and suicidal ideas.    Objective:  BP 98/62   Pulse 64   Ht 6\' 1"  (1.854 m)   Wt 161 lb (73 kg)   SpO2 100%   BMI 21.24 kg/m   BP/Weight 08/15/2019 03/29/2019 0/17/4944  Systolic BP 98 967 591  Diastolic BP 62 78 87  Wt. (Lbs) 161 170 -  BMI 21.24 22.43 -      Physical Exam Constitutional:      Appearance: He is well-developed.  Neck:     Vascular: No JVD.  Cardiovascular:     Rate and Rhythm: Normal rate.     Heart sounds: Normal heart sounds. No murmur heard.   Pulmonary:     Effort: Pulmonary effort is normal.     Breath sounds: Normal breath sounds. No wheezing or rales.  Chest:     Chest wall: No tenderness.  Abdominal:     General: Bowel sounds are normal. There is no distension.     Palpations: Abdomen is soft. There is no mass.     Tenderness: There is no abdominal tenderness.  Musculoskeletal:        General: Normal range of motion.     Right lower leg: No edema.     Left lower leg: No edema.  Neurological:     Mental Status: He is alert and oriented to person, place, and time.  Psychiatric:        Mood and Affect: Mood normal.     CMP Latest Ref Rng & Units 03/29/2019 03/20/2019 07/09/2018  Glucose 65 - 99 mg/dL 99 87 86  BUN 7 - 25 mg/dL 14 15 17   Creatinine 0.70 - 1.25 mg/dL 1.03 1.08 1.00  Sodium 135 - 146 mmol/L 142 142 141  Potassium 3.5 - 5.3 mmol/L 4.3 4.4 4.0  Chloride 98 - 110 mmol/L 110 109(H) 108(H)  CO2 20 - 32 mmol/L 25 21 20   Calcium 8.6 - 10.3 mg/dL 9.3 9.3 8.8  Total Protein 6.1 - 8.1 g/dL 7.3 7.1 6.8  Total Bilirubin 0.2 - 1.2 mg/dL 0.6 0.4 0.4  Alkaline Phos 39 - 117 IU/L - 145(H) 100  AST 10 - 35 U/L 28 34 39  ALT 9 - 46 U/L 18 19 29     Lipid Panel  No results found for: CHOL, TRIG, HDL, CHOLHDL, VLDL, LDLCALC, LDLDIRECT  CBC    Component Value Date/Time   WBC 4.0 03/29/2019 1014   RBC  3.64 (L) 03/29/2019 1014   HGB 13.5 03/29/2019 1014   HGB 13.3 03/20/2019 1038   HCT 38.6 03/29/2019 1014   HCT 37.2 (L) 03/20/2019 1038   PLT 128 (L) 03/29/2019 1014   PLT 132 (L) 03/20/2019 1038   MCV 106.0 (H) 03/29/2019 1014   MCV 106 (H) 03/20/2019 1038   MCH 37.1 (H) 03/29/2019 1014   MCHC 35.0 03/29/2019 1014   RDW 13.9 03/29/2019 1014   RDW 14.3 03/20/2019 1038   LYMPHSABS 2.1 07/09/2018 1644   MONOABS 0.5 06/23/2018 0256   EOSABS 0.1 07/09/2018  1644   BASOSABS 0.0 07/09/2018 1644    No results found for: HGBA1C  Assessment & Plan:  1. Essential hypertension Controlled Counseled on blood pressure goal of less than 130/80, low-sodium, DASH diet, medication compliance, 150 minutes of moderate intensity exercise per week. Discussed medication compliance, adverse effects. - amLODipine (NORVASC) 10 MG tablet; Take 1 tablet (10 mg total) by mouth daily.  Dispense: 30 tablet; Refill: 3 - Basic Metabolic Panel  2. Chronic nausea Stable - ondansetron (ZOFRAN-ODT) 4 MG disintegrating tablet; DISSOLVE 1 TABLET IN MOUTH EVERY 8 HOURS AS NEEDED FOR NAUSEA FOR VOMITING  Dispense: 60 tablet; Refill: 3  3. Pancreatic disease Stable Currently on Creon as well - ondansetron (ZOFRAN-ODT) 4 MG disintegrating tablet; DISSOLVE 1 TABLET IN MOUTH EVERY 8 HOURS AS NEEDED FOR NAUSEA FOR VOMITING  Dispense: 60 tablet; Refill: 3  4. History of GI bleed Resolved - pantoprazole (PROTONIX) 40 MG tablet; Take 1 tablet (40 mg total) by mouth daily.  Dispense: 30 tablet; Refill: 3  5. Tobacco abuse Spent 3 minutes counseling on smoking cessation and he is not ready to quit at this time    Meds ordered this encounter  Medications  . amLODipine (NORVASC) 10 MG tablet    Sig: Take 1 tablet (10 mg total) by mouth daily.    Dispense:  30 tablet    Refill:  3  . levETIRAcetam (KEPPRA) 1000 MG tablet    Sig: Take 1 tablet (1,000 mg total) by mouth 2 (two) times daily.    Dispense:  60 tablet     Refill:  3  . ondansetron (ZOFRAN-ODT) 4 MG disintegrating tablet    Sig: DISSOLVE 1 TABLET IN MOUTH EVERY 8 HOURS AS NEEDED FOR NAUSEA FOR VOMITING    Dispense:  60 tablet    Refill:  3  . pantoprazole (PROTONIX) 40 MG tablet    Sig: Take 1 tablet (40 mg total) by mouth daily.    Dispense:  30 tablet    Refill:  3  . ibuprofen (ADVIL) 600 MG tablet    Sig: Take 1 tablet (600 mg total) by mouth every 8 (eight) hours as needed.    Dispense:  60 tablet    Refill:  1  . potassium chloride (KLOR-CON) 10 MEQ tablet    Sig: Take 1 tablet (10 mEq total) by mouth daily.    Dispense:  30 tablet    Refill:  3    Follow-up: Return in about 3 months (around 11/15/2019) for Chronic disease management.       Charlott Rakes, MD, FAAFP. [facility] [place], [place]   08/15/2019, 9:37 AM

## 2019-08-16 ENCOUNTER — Telehealth: Payer: Self-pay | Admitting: Family Medicine

## 2019-08-16 ENCOUNTER — Telehealth: Payer: Self-pay

## 2019-08-16 LAB — BASIC METABOLIC PANEL
BUN/Creatinine Ratio: 11 (ref 10–24)
BUN: 13 mg/dL (ref 8–27)
CO2: 22 mmol/L (ref 20–29)
Calcium: 8.8 mg/dL (ref 8.6–10.2)
Chloride: 107 mmol/L — ABNORMAL HIGH (ref 96–106)
Creatinine, Ser: 1.15 mg/dL (ref 0.76–1.27)
GFR calc Af Amer: 79 mL/min/{1.73_m2} (ref >59)
GFR calc non Af Amer: 68 mL/min/{1.73_m2} (ref >59)
Glucose: 84 mg/dL (ref 65–99)
Potassium: 3.8 mmol/L (ref 3.5–5.2)
Sodium: 144 mmol/L (ref 134–144)

## 2019-08-16 NOTE — Telephone Encounter (Signed)
Patient name and DOB has been verified Patient was informed of lab results. Patient had no questions.  

## 2019-08-16 NOTE — Telephone Encounter (Signed)
-----   Message from Charlott Rakes, MD sent at 08/16/2019  9:47 AM EDT ----- Please inform the patient that labs are normal. Thank you.

## 2019-08-20 ENCOUNTER — Ambulatory Visit (HOSPITAL_COMMUNITY): Payer: Medicaid Other

## 2019-08-22 ENCOUNTER — Other Ambulatory Visit: Payer: Self-pay | Admitting: Family Medicine

## 2019-08-22 NOTE — Telephone Encounter (Signed)
Requested  medications are  due for refill today yes  Requested medications are on the active medication list yes  Last refill 6/14  Last visit 08/2019  Notes to clinic No protocol for this med, typically muscle relaxants are not delegated.

## 2019-08-25 ENCOUNTER — Other Ambulatory Visit: Payer: Self-pay | Admitting: Family Medicine

## 2019-08-28 ENCOUNTER — Other Ambulatory Visit: Payer: Self-pay | Admitting: Family Medicine

## 2019-10-09 ENCOUNTER — Encounter (HOSPITAL_COMMUNITY): Payer: Self-pay | Admitting: Physical Therapy

## 2019-10-09 NOTE — Therapy (Signed)
Terry Boyle, [place], [address] Phone: [phone]   Fax:  [phone]  Patient Details  Name: Terry Boyle MRN: [id number] Date of Birth: 05/09/1955 Referring Provider:  No ref. provider found  Encounter Date: 10/09/2019   PHYSICAL THERAPY DISCHARGE SUMMARY  Visits from Start of Care: 4  Current functional level related to goals / functional outcomes: Unknown as patient did not return to therapy.    Remaining deficits: Unknown as patient did not return to therapy.    Education / Equipment: HEP Plan: Patient agrees to discharge.  Patient goals were partially met. Patient is being discharged due to not returning since the last visit.  ?????       Terry Boyle PT, DPT 11:37 AM, 10/09/19 [place], [place], [address] Phone: [phone]   Fax:  [phone]

## 2019-11-05 ENCOUNTER — Other Ambulatory Visit: Payer: Self-pay | Admitting: Family Medicine

## 2019-11-05 DIAGNOSIS — R110 Nausea: Secondary | ICD-10-CM

## 2019-11-28 ENCOUNTER — Other Ambulatory Visit: Payer: Self-pay | Admitting: Family Medicine

## 2019-11-30 ENCOUNTER — Other Ambulatory Visit: Payer: Self-pay | Admitting: Family Medicine

## 2019-12-22 ENCOUNTER — Other Ambulatory Visit: Payer: Self-pay | Admitting: Family Medicine

## 2019-12-22 DIAGNOSIS — I10 Essential (primary) hypertension: Secondary | ICD-10-CM

## 2019-12-22 NOTE — Telephone Encounter (Signed)
Requested medication (s) are due for refill today: yes  Requested medication (s) are on the active medication list: yes  Last refill:  08/15/19  Future visit scheduled: no  Notes to clinic:  RF not delegated to NT to RF   Requested Prescriptions  Pending Prescriptions Disp Refills   levETIRAcetam (KEPPRA) 1000 MG tablet [Pharmacy Med Name: levETIRAcetam 1000 MG Oral Tablet] 60 tablet     Sig: Take 1 tablet by mouth twice daily      Not Delegated - Neurology:  Anticonvulsants Failed - 12/22/2019 11:31 AM      Failed - This refill cannot be delegated      Failed - PLT in normal range and within 360 days    Platelets  Date Value Ref Range Status  03/29/2019 128 (L) 140 - 400 Thousand/uL Final  03/20/2019 132 (L) 150 - 450 x10E3/uL Final          Passed - HCT in normal range and within 360 days    HCT  Date Value Ref Range Status  03/29/2019 38.6 38.5 - 50.0 % Final   Hematocrit  Date Value Ref Range Status  03/20/2019 37.2 (L) 37.5 - 51.0 % Final          Passed - HGB in normal range and within 360 days    Hemoglobin  Date Value Ref Range Status  03/29/2019 13.5 13.2 - 17.1 g/dL Final  03/20/2019 13.3 13.0 - 17.7 g/dL Final          Passed - WBC in normal range and within 360 days    WBC  Date Value Ref Range Status  03/29/2019 4.0 3.8 - 10.8 Thousand/uL Final          Passed - Valid encounter within last 12 months    Recent Outpatient Visits           4 months ago Tobacco abuse   [place], Charlane Ferretti, MD   7 months ago Erroneous encounter - disregard   [place], Amy J, NP   9 months ago Seizure disorder [facility])   [place] Fulp, [place], MD   1 year ago Abdominal pain, chronic, generalized   [place], [place], MD                 Signed Prescriptions Disp Refills   amLODipine (NORVASC) 10 MG tablet 30  tablet 0    Sig: Take 1 tablet by mouth once daily      Cardiovascular:  Calcium Channel Blockers Passed - 12/22/2019 11:31 AM      Passed - Last BP in normal range    BP Readings from Last 1 Encounters:  08/15/19 98/62          Passed - Valid encounter within last 6 months    Recent Outpatient Visits           4 months ago Tobacco abuse   [place], Charlane Ferretti, MD   7 months ago Erroneous encounter - disregard   [place], [place], NP   9 months ago Seizure disorder [facility])   [place] Fulp, [place], MD   1 year ago Abdominal pain, chronic, generalized   [place] [facility] [place], [place], MD

## 2019-12-22 NOTE — Telephone Encounter (Signed)
Requested Prescriptions  Pending Prescriptions Disp Refills   amLODipine (NORVASC) 10 MG tablet [Pharmacy Med Name: amLODIPine Besylate 10 MG Oral Tablet] 30 tablet 0    Sig: Take 1 tablet by mouth once daily     Cardiovascular:  Calcium Channel Blockers Passed - 12/22/2019 11:31 AM      Passed - Last BP in normal range    BP Readings from Last 1 Encounters:  08/15/19 98/62         Passed - Valid encounter within last 6 months    Recent Outpatient Visits          4 months ago Tobacco abuse   [place], [place], MD   7 months ago Erroneous encounter - disregard   [place], Amy J, NP   9 months ago Seizure disorder [facility])   [place] Fulp, [place], MD   1 year ago Abdominal pain, chronic, generalized   [place] Fulp, [place], MD              levETIRAcetam (KEPPRA) 1000 MG tablet [Pharmacy Med Name: levETIRAcetam 1000 MG Oral Tablet] 60 tablet     Sig: Take 1 tablet by mouth twice daily     Not Delegated - Neurology:  Anticonvulsants Failed - 12/22/2019 11:31 AM      Failed - This refill cannot be delegated      Failed - PLT in normal range and within 360 days    Platelets  Date Value Ref Range Status  03/29/2019 128 (L) 140 - 400 Thousand/uL Final  03/20/2019 132 (L) 150 - 450 x10E3/uL Final         Passed - HCT in normal range and within 360 days    HCT  Date Value Ref Range Status  03/29/2019 38.6 38.5 - 50.0 % Final   Hematocrit  Date Value Ref Range Status  03/20/2019 37.2 (L) 37.5 - 51.0 % Final         Passed - HGB in normal range and within 360 days    Hemoglobin  Date Value Ref Range Status  03/29/2019 13.5 13.2 - 17.1 g/dL Final  03/20/2019 13.3 13.0 - 17.7 g/dL Final         Passed - WBC in normal range and within 360 days    WBC  Date Value Ref Range Status  03/29/2019 4.0 3.8 - 10.8 Thousand/uL Final          Passed - Valid encounter within last 12 months    Recent Outpatient Visits          4 months ago Tobacco abuse   [place], Charlane Ferretti, MD   7 months ago Erroneous encounter - disregard   [place], [place], NP   9 months ago Seizure disorder [facility])   [place] Fulp, [place], MD   1 year ago Abdominal pain, chronic, generalized   [facility] [place], [place], MD             '

## 2020-01-07 ENCOUNTER — Other Ambulatory Visit: Payer: Self-pay | Admitting: Family Medicine

## 2020-01-22 ENCOUNTER — Other Ambulatory Visit: Payer: Self-pay | Admitting: Family Medicine

## 2020-01-22 DIAGNOSIS — I10 Essential (primary) hypertension: Secondary | ICD-10-CM

## 2020-02-04 ENCOUNTER — Other Ambulatory Visit: Payer: Self-pay | Admitting: Family Medicine

## 2020-02-04 DIAGNOSIS — I10 Essential (primary) hypertension: Secondary | ICD-10-CM

## 2020-02-04 NOTE — Telephone Encounter (Signed)
Requested medication (s) are due for refill today: yes  Requested medication (s) are on the active medication list: yes  Last refill: 01/08/2020  Future visit scheduled: no  Notes to clinic:  this refill cannot be delegated    Requested Prescriptions  Pending Prescriptions Disp Refills   levETIRAcetam (KEPPRA) 1000 MG tablet [Pharmacy Med Name: levETIRAcetam 1000 MG Oral Tablet] 60 tablet 0    Sig: TAKE 1 TABLET BY MOUTH TWICE DAILY MUST  HAVE  OFFICE  VISIT  FOR  REFILLS      Not Delegated - Neurology:  Anticonvulsants Failed - 02/04/2020  9:12 AM      Failed - This refill cannot be delegated      Failed - PLT in normal range and within 360 days    Platelets  Date Value Ref Range Status  03/29/2019 128 (L) 140 - 400 Thousand/uL Final  03/20/2019 132 (L) 150 - 450 x10E3/uL Final          Passed - HCT in normal range and within 360 days    HCT  Date Value Ref Range Status  03/29/2019 38.6 38.5 - 50.0 % Final   Hematocrit  Date Value Ref Range Status  03/20/2019 37.2 (L) 37.5 - 51.0 % Final          Passed - HGB in normal range and within 360 days    Hemoglobin  Date Value Ref Range Status  03/29/2019 13.5 13.2 - 17.1 g/dL Final  03/20/2019 13.3 13.0 - 17.7 g/dL Final          Passed - WBC in normal range and within 360 days    WBC  Date Value Ref Range Status  03/29/2019 4.0 3.8 - 10.8 Thousand/uL Final          Passed - Valid encounter within last 12 months    Recent Outpatient Visits           5 months ago Tobacco abuse   [place], Charlane Ferretti, MD   9 months ago Erroneous encounter - disregard   [place], [place], NP   10 months ago Seizure disorder [facility])   [place] Fulp, [place], MD   1 year ago Abdominal pain, chronic, generalized   [place], [place], MD                 Signed Prescriptions Disp  Refills   ibuprofen (ADVIL) 600 MG tablet 60 tablet 0    Sig: TAKE 1 TABLET BY MOUTH EVERY 8 HOURS AS NEEDED      Analgesics:  NSAIDS Passed - 02/04/2020  9:12 AM      Passed - Cr in normal range and within 360 days    Creat  Date Value Ref Range Status  03/29/2019 1.03 0.70 - 1.25 mg/dL Final    Comment:    For patients >29 years of age, the reference limit for Creatinine is approximately 13% higher for people identified as African-American. .    Creatinine, Ser  Date Value Ref Range Status  08/15/2019 1.15 0.76 - 1.27 mg/dL Final          Passed - HGB in normal range and within 360 days    Hemoglobin  Date Value Ref Range Status  03/29/2019 13.5 13.2 - 17.1 g/dL Final  03/20/2019 13.3 13.0 - 17.7 g/dL Final  Passed - Patient is not pregnant      Passed - Valid encounter within last 12 months    Recent Outpatient Visits           5 months ago Tobacco abuse   [place], Charlane Ferretti, MD   9 months ago Erroneous encounter - disregard   [place], Amy J, NP   10 months ago Seizure disorder [facility])   [place] Fulp, [place], MD   1 year ago Abdominal pain, chronic, generalized   [place] Fulp, [place], MD                  amLODipine (NORVASC) 10 MG tablet 30 tablet 0    Sig: Take 1 tablet by mouth once daily      Cardiovascular:  Calcium Channel Blockers Passed - 02/04/2020  9:12 AM      Passed - Last BP in normal range    BP Readings from Last 1 Encounters:  08/15/19 98/62          Passed - Valid encounter within last 6 months    Recent Outpatient Visits           5 months ago Tobacco abuse   [place], Charlane Ferretti, MD   9 months ago Erroneous encounter - disregard   [place], [place], NP   10 months ago Seizure disorder [facility])    [facility] Fulp, [place], MD   1 year ago Abdominal pain, chronic, generalized   [place] [facility] [place], [place], MD

## 2020-02-17 ENCOUNTER — Ambulatory Visit: Payer: Medicaid Other | Attending: Internal Medicine | Admitting: Internal Medicine

## 2020-02-17 ENCOUNTER — Other Ambulatory Visit: Payer: Self-pay

## 2020-02-17 DIAGNOSIS — R110 Nausea: Secondary | ICD-10-CM

## 2020-02-17 DIAGNOSIS — Z8719 Personal history of other diseases of the digestive system: Secondary | ICD-10-CM | POA: Diagnosis not present

## 2020-02-17 DIAGNOSIS — K869 Disease of pancreas, unspecified: Secondary | ICD-10-CM

## 2020-02-17 DIAGNOSIS — I10 Essential (primary) hypertension: Secondary | ICD-10-CM | POA: Diagnosis not present

## 2020-02-17 DIAGNOSIS — K5900 Constipation, unspecified: Secondary | ICD-10-CM

## 2020-02-17 DIAGNOSIS — Z760 Encounter for issue of repeat prescription: Secondary | ICD-10-CM

## 2020-02-17 MED ORDER — HYOSCYAMINE SULFATE 0.125 MG PO TABS: ORAL_TABLET | ORAL | 3 refills | Status: AC

## 2020-02-17 MED ORDER — LEVETIRACETAM 1000 MG PO TABS
1000.0000 mg | ORAL_TABLET | Freq: Two times a day (BID) | ORAL | 1 refills | Status: AC
Start: 2020-02-17 — End: ?

## 2020-02-17 MED ORDER — IBUPROFEN 600 MG PO TABS: 600.0000 mg | ORAL_TABLET | Freq: Three times a day (TID) | ORAL | 2 refills | Status: AC | PRN

## 2020-02-17 MED ORDER — ONDANSETRON 4 MG PO TBDP
ORAL_TABLET | ORAL | 3 refills | Status: AC
Start: 2020-02-17 — End: ?

## 2020-02-17 MED ORDER — POTASSIUM CHLORIDE ER 10 MEQ PO TBCR: 10.0000 meq | EXTENDED_RELEASE_TABLET | Freq: Every day | ORAL | 0 refills | Status: AC

## 2020-02-17 MED ORDER — PANCRELIPASE (LIP-PROT-AMYL) 12000-38000 UNITS PO CPEP: 12000.0000 [IU] | ORAL_CAPSULE | Freq: Three times a day (TID) | ORAL | 1 refills | Status: AC

## 2020-02-17 MED ORDER — POLYETHYLENE GLYCOL 3350 17 GM/SCOOP PO POWD
ORAL | 3 refills | Status: AC
Start: 2020-02-17 — End: ?

## 2020-02-17 MED ORDER — PANTOPRAZOLE SODIUM 40 MG PO TBEC
40.0000 mg | DELAYED_RELEASE_TABLET | Freq: Every day | ORAL | 3 refills | Status: AC
Start: 2020-02-17 — End: ?

## 2020-02-17 MED ORDER — AMLODIPINE BESYLATE 10 MG PO TABS: 10.0000 mg | ORAL_TABLET | Freq: Every day | ORAL | 1 refills | Status: AC

## 2020-02-17 NOTE — Progress Notes (Signed)
Virtual Visit via Telephone Note  I connected with Terry Boyle on 02/17/20 at 12:17 a.m by telephone and verified that I am speaking with the correct person using two identifiers.  Location: Patient: home Provider: office   I discussed the limitations, risks, security and privacy concerns of performing an evaluation and management service by telephone and the availability of in person appointments. I also discussed with the patient that there may be a patient responsible charge related to this service. The patient expressed understanding and agreed to proceed.   History of Present Illness: history of HTN, tob dep, chronic hepatitis B, SZ disorder, chronic nausea.  Purpose of today's visit is med refill.  PCP was Dr. Chapman Fitch. Saw Dr. Margarita Rana 08/2019 for chronic disease management.  Pt requesting RF on all of his medications.  About to run out.  States he had called for refill and Dr. Margarita Rana had extended his refills but was told he needed an appointment.  No complaints today.  Outpatient Encounter Medications as of 02/17/2020  Medication Sig  . acetaminophen-codeine (TYLENOL #3) 300-30 MG tablet Take 1 tablet by mouth every 8 (eight) hours as needed for moderate pain.  Marland Kitchen albuterol (VENTOLIN HFA) 108 (90 Base) MCG/ACT inhaler 2 puffs every 4-6 hours as needed  . amLODipine (NORVASC) 10 MG tablet Take 1 tablet by mouth once daily  . amoxicillin (AMOXIL) 500 MG capsule Take 2,000 mg by mouth See admin instructions. Take 4 capsules (2000 mg) by mouth one hour prior to dental appointment (Patient not taking: No sig reported)  . hyoscyamine (LEVSIN) 0.125 MG tablet TAKE 1 TABLET BY MOUTH EVERY 6 HOURS AS NEEDED FOR CRAMPS  . ibuprofen (ADVIL) 600 MG tablet TAKE 1 TABLET BY MOUTH EVERY 8 HOURS AS NEEDED  . levETIRAcetam (KEPPRA) 1000 MG tablet Take 1 tablet (1,000 mg total) by mouth 2 (two) times daily. Please make PCP appointment. Last fill!  . lipase/protease/amylase (CREON) 12000 units CPEP capsule Take  1 capsule (12,000 Units total) by mouth 3 (three) times daily with meals.  . ondansetron (ZOFRAN-ODT) 4 MG disintegrating tablet DISSOLVE 1 TABLET IN MOUTH EVERY 8 HOURS AS NEEDED FOR NAUSEA FOR VOMITING  . pantoprazole (PROTONIX) 40 MG tablet Take 1 tablet (40 mg total) by mouth daily.  . polyethylene glycol powder (GLYCOLAX/MIRALAX) 17 GM/SCOOP powder Add 1 scoop (17 gm) of powder to 8 or more ounces of liquid once daily to treat constipation  . potassium chloride (KLOR-CON) 10 MEQ tablet Take 1 tablet by mouth once daily  . tenofovir (VIREAD) 300 MG tablet Take 1 tablet (300 mg total) by mouth daily.  Marland Kitchen tiZANidine (ZANAFLEX) 4 MG tablet TAKE 1 TABLET BY MOUTH AT BEDTIME AS NEEDED FOR MUSCLE SPASM   No facility-administered encounter medications on file as of 02/17/2020.      Observations/Objective: No direct observation done as this was a telephone visit.  Assessment and Plan: 1. Encounter for medication refill Today's visit was primarily for medication refills.  2. Essential hypertension - amLODipine (NORVASC) 10 MG tablet; Take 1 tablet (10 mg total) by mouth daily.  Dispense: 90 tablet; Refill: 1  3. History of GI bleed - pantoprazole (PROTONIX) 40 MG tablet; Take 1 tablet (40 mg total) by mouth daily.  Dispense: 30 tablet; Refill: 3  4. Chronic nausea - hyoscyamine (LEVSIN) 0.125 MG tablet; TAKE 1 TABLET BY MOUTH EVERY 6 HOURS AS NEEDED FOR CRAMPS  Dispense: 60 tablet; Refill: 3 - ondansetron (ZOFRAN-ODT) 4 MG disintegrating tablet; DISSOLVE 1 TABLET IN MOUTH  EVERY 8 HOURS AS NEEDED FOR NAUSEA FOR VOMITING  Dispense: 60 tablet; Refill: 3  5. Pancreatic disease - ondansetron (ZOFRAN-ODT) 4 MG disintegrating tablet; DISSOLVE 1 TABLET IN MOUTH EVERY 8 HOURS AS NEEDED FOR NAUSEA FOR VOMITING  Dispense: 60 tablet; Refill: 3  6. Constipation, unspecified constipation type - polyethylene glycol powder (GLYCOLAX/MIRALAX) 17 GM/SCOOP powder; Add 1 scoop (17 gm) of powder to 8 or more  ounces of liquid once daily to treat constipation  Dispense: 507 g; Refill: 3   Follow Up Instructions: 1-6 wks with PCP   I discussed the assessment and treatment plan with the patient. The patient was provided an opportunity to ask questions and all were answered. The patient agreed with the plan and demonstrated an understanding of the instructions.   The patient was advised to call back or seek an in-person evaluation if the symptoms worsen or if the condition fails to improve as anticipated.  I provided 6 minutes of non-face-to-face time during this encounter.   Karle Plumber, MD

## 2020-03-30 ENCOUNTER — Other Ambulatory Visit: Payer: Self-pay

## 2020-03-30 ENCOUNTER — Ambulatory Visit: Payer: Medicaid Other | Attending: Family Medicine | Admitting: Family Medicine

## 2020-04-27 ENCOUNTER — Inpatient Hospital Stay (HOSPITAL_COMMUNITY)
Admission: EM | Admit: 2020-04-27 | Discharge: 2020-05-10 | DRG: 870 | Disposition: E | Payer: Medicaid Other | Attending: Internal Medicine | Admitting: Internal Medicine

## 2020-04-27 ENCOUNTER — Emergency Department (HOSPITAL_COMMUNITY): Payer: Medicaid Other

## 2020-04-27 ENCOUNTER — Encounter (HOSPITAL_COMMUNITY): Payer: Self-pay | Admitting: *Deleted

## 2020-04-27 ENCOUNTER — Inpatient Hospital Stay (HOSPITAL_COMMUNITY): Payer: Medicaid Other

## 2020-04-27 ENCOUNTER — Other Ambulatory Visit: Payer: Self-pay

## 2020-04-27 DIAGNOSIS — E870 Hyperosmolality and hypernatremia: Secondary | ICD-10-CM | POA: Diagnosis not present

## 2020-04-27 DIAGNOSIS — R6520 Severe sepsis without septic shock: Secondary | ICD-10-CM

## 2020-04-27 DIAGNOSIS — I8510 Secondary esophageal varices without bleeding: Secondary | ICD-10-CM | POA: Diagnosis present

## 2020-04-27 DIAGNOSIS — Z66 Do not resuscitate: Secondary | ICD-10-CM | POA: Diagnosis not present

## 2020-04-27 DIAGNOSIS — Z79899 Other long term (current) drug therapy: Secondary | ICD-10-CM

## 2020-04-27 DIAGNOSIS — B181 Chronic viral hepatitis B without delta-agent: Secondary | ICD-10-CM | POA: Diagnosis present

## 2020-04-27 DIAGNOSIS — R571 Hypovolemic shock: Secondary | ICD-10-CM | POA: Diagnosis not present

## 2020-04-27 DIAGNOSIS — Z515 Encounter for palliative care: Secondary | ICD-10-CM

## 2020-04-27 DIAGNOSIS — Z20822 Contact with and (suspected) exposure to covid-19: Secondary | ICD-10-CM | POA: Diagnosis present

## 2020-04-27 DIAGNOSIS — Z01818 Encounter for other preprocedural examination: Secondary | ICD-10-CM | POA: Diagnosis not present

## 2020-04-27 DIAGNOSIS — R680 Hypothermia, not associated with low environmental temperature: Secondary | ICD-10-CM | POA: Diagnosis present

## 2020-04-27 DIAGNOSIS — K3189 Other diseases of stomach and duodenum: Secondary | ICD-10-CM | POA: Diagnosis present

## 2020-04-27 DIAGNOSIS — Z4659 Encounter for fitting and adjustment of other gastrointestinal appliance and device: Secondary | ICD-10-CM

## 2020-04-27 DIAGNOSIS — D539 Nutritional anemia, unspecified: Secondary | ICD-10-CM | POA: Diagnosis present

## 2020-04-27 DIAGNOSIS — K7460 Unspecified cirrhosis of liver: Secondary | ICD-10-CM | POA: Diagnosis present

## 2020-04-27 DIAGNOSIS — D696 Thrombocytopenia, unspecified: Secondary | ICD-10-CM | POA: Diagnosis present

## 2020-04-27 DIAGNOSIS — R6521 Severe sepsis with septic shock: Secondary | ICD-10-CM | POA: Diagnosis present

## 2020-04-27 DIAGNOSIS — G931 Anoxic brain damage, not elsewhere classified: Secondary | ICD-10-CM | POA: Diagnosis present

## 2020-04-27 DIAGNOSIS — K7200 Acute and subacute hepatic failure without coma: Secondary | ICD-10-CM | POA: Diagnosis present

## 2020-04-27 DIAGNOSIS — I10 Essential (primary) hypertension: Secondary | ICD-10-CM | POA: Diagnosis present

## 2020-04-27 DIAGNOSIS — K449 Diaphragmatic hernia without obstruction or gangrene: Secondary | ICD-10-CM | POA: Diagnosis present

## 2020-04-27 DIAGNOSIS — K766 Portal hypertension: Secondary | ICD-10-CM | POA: Diagnosis present

## 2020-04-27 DIAGNOSIS — I5040 Unspecified combined systolic (congestive) and diastolic (congestive) heart failure: Secondary | ICD-10-CM | POA: Diagnosis present

## 2020-04-27 DIAGNOSIS — R7881 Bacteremia: Secondary | ICD-10-CM | POA: Diagnosis not present

## 2020-04-27 DIAGNOSIS — Z789 Other specified health status: Secondary | ICD-10-CM

## 2020-04-27 DIAGNOSIS — D689 Coagulation defect, unspecified: Secondary | ICD-10-CM | POA: Diagnosis present

## 2020-04-27 DIAGNOSIS — R4182 Altered mental status, unspecified: Secondary | ICD-10-CM | POA: Diagnosis present

## 2020-04-27 DIAGNOSIS — I110 Hypertensive heart disease with heart failure: Secondary | ICD-10-CM | POA: Diagnosis present

## 2020-04-27 DIAGNOSIS — G40909 Epilepsy, unspecified, not intractable, without status epilepticus: Secondary | ICD-10-CM | POA: Diagnosis present

## 2020-04-27 DIAGNOSIS — K921 Melena: Secondary | ICD-10-CM | POA: Diagnosis not present

## 2020-04-27 DIAGNOSIS — N170 Acute kidney failure with tubular necrosis: Secondary | ICD-10-CM | POA: Diagnosis present

## 2020-04-27 DIAGNOSIS — Z8711 Personal history of peptic ulcer disease: Secondary | ICD-10-CM

## 2020-04-27 DIAGNOSIS — K2950 Unspecified chronic gastritis without bleeding: Secondary | ICD-10-CM | POA: Diagnosis present

## 2020-04-27 DIAGNOSIS — A419 Sepsis, unspecified organism: Secondary | ICD-10-CM | POA: Diagnosis present

## 2020-04-27 DIAGNOSIS — E860 Dehydration: Secondary | ICD-10-CM | POA: Diagnosis present

## 2020-04-27 DIAGNOSIS — J9601 Acute respiratory failure with hypoxia: Secondary | ICD-10-CM | POA: Diagnosis present

## 2020-04-27 DIAGNOSIS — K559 Vascular disorder of intestine, unspecified: Secondary | ICD-10-CM | POA: Diagnosis present

## 2020-04-27 DIAGNOSIS — E872 Acidosis: Secondary | ICD-10-CM | POA: Diagnosis present

## 2020-04-27 DIAGNOSIS — M6282 Rhabdomyolysis: Secondary | ICD-10-CM | POA: Diagnosis present

## 2020-04-27 DIAGNOSIS — Z21 Asymptomatic human immunodeficiency virus [HIV] infection status: Secondary | ICD-10-CM | POA: Diagnosis present

## 2020-04-27 DIAGNOSIS — F17210 Nicotine dependence, cigarettes, uncomplicated: Secondary | ICD-10-CM | POA: Diagnosis present

## 2020-04-27 DIAGNOSIS — Z7189 Other specified counseling: Secondary | ICD-10-CM | POA: Diagnosis not present

## 2020-04-27 DIAGNOSIS — G9389 Other specified disorders of brain: Secondary | ICD-10-CM | POA: Diagnosis present

## 2020-04-27 DIAGNOSIS — K7469 Other cirrhosis of liver: Secondary | ICD-10-CM | POA: Diagnosis not present

## 2020-04-27 DIAGNOSIS — K922 Gastrointestinal hemorrhage, unspecified: Secondary | ICD-10-CM | POA: Diagnosis not present

## 2020-04-27 DIAGNOSIS — Z452 Encounter for adjustment and management of vascular access device: Secondary | ICD-10-CM

## 2020-04-27 DIAGNOSIS — R578 Other shock: Secondary | ICD-10-CM | POA: Diagnosis not present

## 2020-04-27 DIAGNOSIS — G9341 Metabolic encephalopathy: Secondary | ICD-10-CM | POA: Diagnosis present

## 2020-04-27 DIAGNOSIS — J189 Pneumonia, unspecified organism: Secondary | ICD-10-CM | POA: Diagnosis not present

## 2020-04-27 LAB — COMPREHENSIVE METABOLIC PANEL
ALT: 107 U/L — ABNORMAL HIGH (ref 0–44)
AST: 167 U/L — ABNORMAL HIGH (ref 15–41)
Albumin: 4.4 g/dL (ref 3.5–5.0)
Alkaline Phosphatase: 118 U/L (ref 38–126)
Anion gap: 24 — ABNORMAL HIGH (ref 5–15)
BUN: 146 mg/dL — ABNORMAL HIGH (ref 8–23)
CO2: 13 mmol/L — ABNORMAL LOW (ref 22–32)
Calcium: 9.1 mg/dL (ref 8.9–10.3)
Chloride: 99 mmol/L (ref 98–111)
Creatinine, Ser: 7.05 mg/dL — ABNORMAL HIGH (ref 0.61–1.24)
GFR, Estimated: 8 mL/min — ABNORMAL LOW (ref >60)
Glucose, Bld: 141 mg/dL — ABNORMAL HIGH (ref 70–99)
Potassium: 3.9 mmol/L (ref 3.5–5.1)
Sodium: 136 mmol/L (ref 135–145)
Total Bilirubin: 3 mg/dL — ABNORMAL HIGH (ref 0.3–1.2)
Total Protein: 8.8 g/dL — ABNORMAL HIGH (ref 6.5–8.1)

## 2020-04-27 LAB — CBC WITH DIFFERENTIAL/PLATELET
Abs Immature Granulocytes: 0.04 10*3/uL (ref 0.00–0.07)
Abs Immature Granulocytes: 0.06 10*3/uL (ref 0.00–0.07)
Basophils Absolute: 0 10*3/uL (ref 0.0–0.1)
Basophils Absolute: 0 10*3/uL (ref 0.0–0.1)
Basophils Relative: 0 %
Basophils Relative: 0 %
Eosinophils Absolute: 0 10*3/uL (ref 0.0–0.5)
Eosinophils Absolute: 0 10*3/uL (ref 0.0–0.5)
Eosinophils Relative: 0 %
Eosinophils Relative: 0 %
HCT: 46 % (ref 39.0–52.0)
HCT: 33.3 % — ABNORMAL LOW (ref 39.0–52.0)
Hemoglobin: 16.8 g/dL (ref 13.0–17.0)
Hemoglobin: 12.2 g/dL — ABNORMAL LOW (ref 13.0–17.0)
Immature Granulocytes: 0 %
Immature Granulocytes: 1 %
Lymphocytes Relative: 2 %
Lymphocytes Relative: 2 %
Lymphs Abs: 0.2 10*3/uL — ABNORMAL LOW (ref 0.7–4.0)
Lymphs Abs: 0.3 10*3/uL — ABNORMAL LOW (ref 0.7–4.0)
MCH: 37.1 pg — ABNORMAL HIGH (ref 26.0–34.0)
MCH: 37.4 pg — ABNORMAL HIGH (ref 26.0–34.0)
MCHC: 36.5 g/dL — ABNORMAL HIGH (ref 30.0–36.0)
MCHC: 36.6 g/dL — ABNORMAL HIGH (ref 30.0–36.0)
MCV: 101.5 fL — ABNORMAL HIGH (ref 80.0–100.0)
MCV: 102.1 fL — ABNORMAL HIGH (ref 80.0–100.0)
Monocytes Absolute: 1 10*3/uL (ref 0.1–1.0)
Monocytes Absolute: 1.7 10*3/uL — ABNORMAL HIGH (ref 0.1–1.0)
Monocytes Relative: 9 %
Monocytes Relative: 14 %
Neutro Abs: 9 10*3/uL — ABNORMAL HIGH (ref 1.7–7.7)
Neutro Abs: 10.2 10*3/uL — ABNORMAL HIGH (ref 1.7–7.7)
Neutrophils Relative %: 89 %
Neutrophils Relative %: 83 %
Platelets: 134 10*3/uL — ABNORMAL LOW (ref 150–400)
Platelets: 99 10*3/uL — ABNORMAL LOW (ref 150–400)
RBC: 4.53 MIL/uL (ref 4.22–5.81)
RBC: 3.26 MIL/uL — ABNORMAL LOW (ref 4.22–5.81)
RDW: 12.6 % (ref 11.5–15.5)
RDW: 12.6 % (ref 11.5–15.5)
WBC: 10.2 10*3/uL (ref 4.0–10.5)
WBC: 12.2 10*3/uL — ABNORMAL HIGH (ref 4.0–10.5)
nRBC: 0 % (ref 0.0–0.2)
nRBC: 0.2 % (ref 0.0–0.2)

## 2020-04-27 LAB — BLOOD GAS, ARTERIAL
Acid-base deficit: 12.8 mmol/L — ABNORMAL HIGH (ref 0.0–2.0)
Acid-base deficit: 12 mmol/L — ABNORMAL HIGH (ref 0.0–2.0)
Bicarbonate: 15.6 mmol/L — ABNORMAL LOW (ref 20.0–28.0)
Bicarbonate: 15.1 mmol/L — ABNORMAL LOW (ref 20.0–28.0)
FIO2: 21
FIO2: 100
MECHVT: 630 mL
O2 Saturation: 98.2 %
O2 Saturation: 99.3 %
PEEP: 5 cmH2O
Patient temperature: 32
Patient temperature: 36.2
RATE: 16 resp/min
pCO2 arterial: <19 mmHg — CL (ref 32.0–48.0)
pCO2 arterial: 30.7 mmHg — ABNORMAL LOW (ref 32.0–48.0)
pH, Arterial: 7.419 (ref 7.350–7.450)
pH, Arterial: 7.267 — ABNORMAL LOW (ref 7.350–7.450)
pO2, Arterial: 123 mmHg — ABNORMAL HIGH (ref 83.0–108.0)
pO2, Arterial: 546 mmHg — ABNORMAL HIGH (ref 83.0–108.0)

## 2020-04-27 LAB — URINALYSIS, ROUTINE W REFLEX MICROSCOPIC
Bacteria, UA: NONE SEEN
Bilirubin Urine: NEGATIVE
Glucose, UA: NEGATIVE mg/dL
Ketones, ur: NEGATIVE mg/dL
Leukocytes,Ua: NEGATIVE
Nitrite: NEGATIVE
Protein, ur: NEGATIVE mg/dL
Specific Gravity, Urine: 1.013 (ref 1.005–1.030)
pH: 6 (ref 5.0–8.0)

## 2020-04-27 LAB — RESP PANEL BY RT-PCR (FLU A&B, COVID) ARPGX2
Influenza A by PCR: NEGATIVE
Influenza B by PCR: NEGATIVE
SARS Coronavirus 2 by RT PCR: NEGATIVE

## 2020-04-27 LAB — TROPONIN I (HIGH SENSITIVITY)
Troponin I (High Sensitivity): 53 ng/L — ABNORMAL HIGH (ref <18)
Troponin I (High Sensitivity): 39 ng/L — ABNORMAL HIGH (ref <18)

## 2020-04-27 LAB — RAPID URINE DRUG SCREEN, HOSP PERFORMED
Amphetamines: NOT DETECTED
Barbiturates: NOT DETECTED
Benzodiazepines: NOT DETECTED
Cocaine: NOT DETECTED
Opiates: NOT DETECTED
Tetrahydrocannabinol: POSITIVE — AB

## 2020-04-27 LAB — CK: Total CK: 3270 U/L — ABNORMAL HIGH (ref 49–397)

## 2020-04-27 LAB — LACTIC ACID, PLASMA
Lactic Acid, Venous: 6.4 mmol/L (ref 0.5–1.9)
Lactic Acid, Venous: 6.7 mmol/L (ref 0.5–1.9)

## 2020-04-27 LAB — GLUCOSE, CAPILLARY: Glucose-Capillary: 157 mg/dL — ABNORMAL HIGH (ref 70–99)

## 2020-04-27 LAB — BRAIN NATRIURETIC PEPTIDE: B Natriuretic Peptide: 97 pg/mL (ref 0.0–100.0)

## 2020-04-27 LAB — MRSA PCR SCREENING: MRSA by PCR: NEGATIVE

## 2020-04-27 LAB — TSH: TSH: 0.276 u[IU]/mL — ABNORMAL LOW (ref 0.350–4.500)

## 2020-04-27 MED ORDER — SODIUM CHLORIDE 0.9 % IV BOLUS
1000.0000 mL | Freq: Once | INTRAVENOUS | Status: AC
  Administered 2020-04-27: 1000 mL via INTRAVENOUS

## 2020-04-27 MED ORDER — NOREPINEPHRINE 4 MG/250ML-% IV SOLN
INTRAVENOUS | Status: AC
  Administered 2020-04-27: 5 ug/min via INTRAVENOUS
  Filled 2020-04-27: qty 250

## 2020-04-27 MED ORDER — PIPERACILLIN-TAZOBACTAM 3.375 G IVPB
3.3750 g | Freq: Once | INTRAVENOUS | Status: AC
  Administered 2020-04-27: 3.375 g via INTRAVENOUS
  Filled 2020-04-27: qty 50

## 2020-04-27 MED ORDER — ROCURONIUM BROMIDE 50 MG/5ML IV SOLN
INTRAVENOUS | Status: AC | PRN
  Administered 2020-04-27: 100 mg via INTRAVENOUS

## 2020-04-27 MED ORDER — ORAL CARE MOUTH RINSE
15.0000 mL | OROMUCOSAL | Status: DC
  Administered 2020-04-27 – 2020-04-28 (×5): 15 mL via OROMUCOSAL

## 2020-04-27 MED ORDER — PANTOPRAZOLE SODIUM 40 MG IV SOLR: 40.0000 mg | Freq: Two times a day (BID) | INTRAVENOUS | Status: DC

## 2020-04-27 MED ORDER — ETOMIDATE 2 MG/ML IV SOLN
INTRAVENOUS | Status: AC | PRN
  Administered 2020-04-27: 20 mg via INTRAVENOUS

## 2020-04-27 MED ORDER — PIPERACILLIN-TAZOBACTAM 4.5 G IVPB
4.5000 g | Freq: Once | INTRAVENOUS | Status: DC
  Filled 2020-04-27: qty 100

## 2020-04-27 MED ORDER — CHLORHEXIDINE GLUCONATE 0.12% ORAL RINSE (MEDLINE KIT)
15.0000 mL | Freq: Two times a day (BID) | OROMUCOSAL | Status: DC
  Administered 2020-04-27 – 2020-04-28 (×2): 15 mL via OROMUCOSAL

## 2020-04-27 MED ORDER — SODIUM CHLORIDE 0.9 % IV SOLN
8.0000 mg/h | INTRAVENOUS | Status: DC
  Administered 2020-04-28 – 2020-04-30 (×6): 8 mg/h via INTRAVENOUS
  Filled 2020-04-27 (×7): qty 80

## 2020-04-27 MED ORDER — SODIUM CHLORIDE 0.9 % IV BOLUS
30.0000 mL/kg | Freq: Once | INTRAVENOUS | Status: AC
  Administered 2020-04-27: 2190 mL via INTRAVENOUS

## 2020-04-27 MED ORDER — SODIUM CHLORIDE 0.9 % IV SOLN: INTRAVENOUS | Status: DC

## 2020-04-27 MED ORDER — SODIUM CHLORIDE 0.9 % IV SOLN
80.0000 mg | Freq: Once | INTRAVENOUS | Status: AC
  Administered 2020-04-28: 80 mg via INTRAVENOUS
  Filled 2020-04-27: qty 80

## 2020-04-27 MED ORDER — DOCUSATE SODIUM 100 MG PO CAPS: 100.0000 mg | ORAL_CAPSULE | Freq: Two times a day (BID) | ORAL | Status: DC | PRN

## 2020-04-27 MED ORDER — POLYETHYLENE GLYCOL 3350 17 G PO PACK: 17.0000 g | PACK | Freq: Every day | ORAL | Status: DC | PRN

## 2020-04-27 MED ORDER — SODIUM CHLORIDE 0.9 % IV SOLN
8.0000 mg/h | INTRAVENOUS | Status: DC
  Filled 2020-04-27: qty 80

## 2020-04-27 MED ORDER — PANTOPRAZOLE SODIUM 40 MG IV SOLR
40.0000 mg | Freq: Two times a day (BID) | INTRAVENOUS | Status: DC
  Filled 2020-04-27: qty 40

## 2020-04-27 MED ORDER — PIPERACILLIN-TAZOBACTAM IN DEX 2-0.25 GM/50ML IV SOLN
2.2500 g | Freq: Three times a day (TID) | INTRAVENOUS | Status: AC
  Administered 2020-04-28 – 2020-04-29 (×6): 2.25 g via INTRAVENOUS
  Filled 2020-04-27 (×6): qty 50

## 2020-04-27 MED ORDER — SODIUM CHLORIDE 0.9 % IV BOLUS
500.0000 mL | Freq: Once | INTRAVENOUS | Status: AC
  Administered 2020-04-27: 500 mL via INTRAVENOUS

## 2020-04-27 MED ORDER — SODIUM CHLORIDE 0.9 % IV SOLN: Freq: Once | INTRAVENOUS | Status: AC

## 2020-04-27 MED ORDER — LEVETIRACETAM 500 MG PO TABS
1000.0000 mg | ORAL_TABLET | Freq: Two times a day (BID) | ORAL | Status: DC
  Administered 2020-04-28: 1000 mg via ORAL
  Filled 2020-04-27: qty 2

## 2020-04-27 MED ORDER — VANCOMYCIN VARIABLE DOSE PER UNSTABLE RENAL FUNCTION (PHARMACIST DOSING): Status: DC

## 2020-04-27 MED ORDER — SODIUM CHLORIDE 0.9 % IV SOLN
250.0000 mL | INTRAVENOUS | Status: DC
  Administered 2020-04-27: 250 mL via INTRAVENOUS

## 2020-04-27 MED ORDER — SODIUM CHLORIDE 0.9 % IV SOLN
80.0000 mg | Freq: Once | INTRAVENOUS | Status: DC
  Filled 2020-04-27: qty 80

## 2020-04-27 MED ORDER — NOREPINEPHRINE 4 MG/250ML-% IV SOLN: 0.0000 ug/min | INTRAVENOUS | Status: DC

## 2020-04-27 MED ORDER — VANCOMYCIN HCL 1500 MG/300ML IV SOLN
1500.0000 mg | Freq: Once | INTRAVENOUS | Status: AC
  Administered 2020-04-27: 1500 mg via INTRAVENOUS
  Filled 2020-04-27: qty 300

## 2020-04-27 MED ORDER — NOREPINEPHRINE 4 MG/250ML-% IV SOLN
2.0000 ug/min | INTRAVENOUS | Status: DC
  Administered 2020-04-27: 9 ug/min via INTRAVENOUS
  Administered 2020-04-28: 20 ug/min via INTRAVENOUS
  Filled 2020-04-27 (×2): qty 250

## 2020-04-27 MED ORDER — CHLORHEXIDINE GLUCONATE CLOTH 2 % EX PADS
6.0000 | MEDICATED_PAD | Freq: Every day | CUTANEOUS | Status: DC
  Administered 2020-04-27 – 2020-05-07 (×10): 6 via TOPICAL

## 2020-04-27 NOTE — ED Notes (Signed)
Report given to [facility] with [facility].

## 2020-04-27 NOTE — ED Provider Notes (Signed)
Patient awaiting transfer to [facility] intensive care unit.  I was asked by the nursing to evaluate him for intubation as his mental status is declined.  Blood pressure soft at 80.  No response to stimulus.  Intubated and started on Levophed. Physical Exam  BP (!) 77/49   Pulse (!) 105   Temp (!) 97.5 F (36.4 C)   Resp 17   Ht 6\' 1"  (1.854 m)   Wt 73 kg   SpO2 100%   BMI 21.23 kg/m   Physical Exam  ED Course/Procedures     Procedure Name: Intubation Date/Time: 04/25/2020 7:40 PM Performed by: Hayden Rasmussen, MD Pre-anesthesia Checklist: Patient identified, Patient being monitored, Emergency Drugs available, Timeout performed and Suction available Oxygen Delivery Method: Non-rebreather mask Preoxygenation: Pre-oxygenation with 100% oxygen Induction Type: Rapid sequence Ventilation: Mask ventilation without difficulty Laryngoscope Size: Glidescope and 3 Grade View: Grade II Tube size: 7.5 mm Number of attempts: 1 Placement Confirmation: ETT inserted through vocal cords under direct vision,  CO2 detector and Breath sounds checked- equal and bilateral Secured at: 25 cm Tube secured with: ETT holder Dental Injury: Teeth and Oropharynx as per pre-operative assessment  Difficulty Due To: Difficulty was anticipated       MDM  After intubation patient had a large maroon bowel movement.       Hayden Rasmussen, MD 04/28/20 [phone]

## 2020-04-27 NOTE — Code Documentation (Signed)
No gastric content expelled from ET tube. Breath sounds noted bilaterally.

## 2020-04-27 NOTE — ED Notes (Signed)
Pt making spontaneous movements in the bed. PT has turned to the right slightly and lifted his head. Still does not make eye contact or blink to threat.

## 2020-04-27 NOTE — Progress Notes (Signed)
Called in report to [place], RRT at [facility] on patient.

## 2020-04-27 NOTE — Code Documentation (Signed)
7.5 et tube noted, 25 @ the gum

## 2020-04-27 NOTE — H&P (Signed)
NAME:  Terry Boyle, MRN:  [id number], DOB:  [birth year], LOS: 0 ADMISSION DATE:  04/21/2020, CONSULTATION DATE:  04/28/2020 REFERRING MD:  ED at [facility], CHIEF COMPLAINT:  Altered mental status   History of Present Illness:  Mr. Terry Boyle was found down by neighbor who checks on him daily, last seen normal 04/26/20 PM.  Initially described as completely unresponsive, was hypothermic (initial temp 87.6). .   At 4pm hour described as making spontaneous movements, turning head, still without eye contact or blink to threat. Intubated at about 730pm for altered mental status.  Episode of bloody stool after intubation at ED.   Hypotension, started on levophed at around 7pm prior to intubation.   Moderate volume BRBPR here on arrival in ICU.   In ED received NS 3.5 L bolus total, zosyn/vanc, levophed.  No uop recorded  Pertinent  Medical History  Unknown to me  HIV HTN Chronic hepB Seizure hx Chronic nausea Cirrhosis   Takes ibuprofen as of 08/2019  Takes Keppra 1000 BID, antiHTN meds, tylenol with codein, albuterol, Levsin, Creon, miralax, enofovir, tizanidine, amlodipine, zofran Zofran,  Hx of EGD, Hx of crani  Significant Hospital Events: Including procedures, antibiotic start and stop dates in addition to other pertinent events   .   Interim History / Subjective:    Called ED at [facility] to notify that note from initial eval not present.  Objective   Blood pressure (!) 99/58, pulse 84, temperature (!) 94.64 F (34.8 C), resp. rate 16, height 6\' 1"  (1.854 m), weight 73 kg, SpO2 100 %.    Vent Mode: PRVC FiO2 (%):  [100 %] 100 % Set Rate:  [16 bmp] 16 bmp Vt Set:  [630 mL] 630 mL PEEP:  [5 cmH20] 5 cmH20 Plateau Pressure:  [13 cmH20] 13 cmH20   Intake/Output Summary (Last 24 hours) at 04/16/2020 2214 Last data filed at 04/16/2020 2032 Gross per 24 hour  Intake 4092.22 ml  Output --  Net 4092.22 ml   Filed Weights   04/26/2020 1112  Weight: 73 kg    Examination: General:NAD,  non responsive, on no sedation, skin intact  HENT: NCAT, Pupils about 4 mm, responsive Lungs: CTAB  Cardiovascular: RRR no mgr Abdomen: no apparent tenderness but pt obtunded. NBS, non distended Extremities: no swelling or erythema Neuro: non resopnsive, no gag GU: foley  Labs/imaging that I havepersonally reviewed  (right click and "Reselect all SmartList Selections" daily)  ABG: 7.419/<19/123 7.26/30/546 AKI Cr 7.05 (baseline 1.15) BUN 146 CO3 13 AG 24  Mild elevation in LFTs  ASST 167, ALT 107,.bili 3 CPK 3270 Lact 6.4 --> 6.7  CXR EKG   Resolved Hospital Problem list     Assessment & Plan:  Hypothermia, hypotension - possible sepsis.  Unclear source; ua clean, no skin lesions, cxr clear (maybe due to dehydration).  Consider LP once can recheck platelets and PtINR.   Bili slightly elevated, consider GI source. RUQ [place] pending.  Given Zosyn/Vanc.  Cont levophed. Maint fluids.   Encephalopathy: CT head neg. Possibly 2/2 sepis but seems out of proportion.  Possibly 2/2 rhabdo?  Labs; abg, PT inr. CMP CK Lactic CBC FTF procal Ammonia etoh   Rhabdo AKI \ Found down, UOP improveding Bun/Cr improving.  Cont fluids.   Elevated transaminases, elevated bili.   GIB: brbpr. Monitor.  Tx if necessary.  Consider IR or GI urgently if starts again.   Protonix gtt.   HIV  Liver Cirrhosis    Best practice (right click and "  Reselect all SmartList Selections" daily)  Diet:  NPO Pain/Anxiety/Delirium protocol (if indicated): Yes (RASS goal 0) VAP protocol (if indicated): Yes DVT prophylaxis: Contraindicated GI prophylaxis: PPI Glucose control:  SSI No Central venous access:  N/A Arterial line:  N/A Foley:  N/A Mobility:  bed rest  PT consulted: N/A Last date of multidisciplinary goals of care discussion []  Code Status:  full code Disposition: ICU Unable to reach spouse, listed contact person.  Message left.   Labs   CBC: Recent Labs  Lab 04/19/2020 1136  05/04/2020 1951  WBC 10.2 12.2*  NEUTROABS 9.0* 10.2*  HGB 16.8 12.2*  HCT 46.0 33.3*  MCV 101.5* 102.1*  PLT 134* 99*    Basic Metabolic Panel: Recent Labs  Lab 04/23/2020 1136  NA 136  K 3.9  CL 99  CO2 13*  GLUCOSE 141*  BUN 146*  CREATININE 7.05*  CALCIUM 9.1   GFR: Estimated Creatinine Clearance: 11.2 mL/min (A) (by C-G formula based on SCr of 7.05 mg/dL (H)). Recent Labs  Lab 04/12/2020 1136 04/10/2020 1241 04/22/2020 1951  WBC 10.2  --  12.2*  LATICACIDVEN 6.4* 6.7*  --     Liver Function Tests: Recent Labs  Lab 04/21/2020 1136  AST 167*  ALT 107*  ALKPHOS 118  BILITOT 3.0*  PROT 8.8*  ALBUMIN 4.4   No results for input(s): LIPASE, AMYLASE in the last 168 hours. No results for input(s): AMMONIA in the last 168 hours.  ABG    Component Value Date/Time   PHART 7.267 (L) 04/20/2020 2039   PCO2ART 30.7 (L) 04/20/2020 2039   PO2ART 546 (H) 05/08/2020 2039   HCO3 15.1 (L) 04/24/2020 2039   ACIDBASEDEF 12.0 (H) 04/25/2020 2039   O2SAT 99.3 04/17/2020 2039     Coagulation Profile: No results for input(s): INR, PROTIME in the last 168 hours.  Cardiac Enzymes: Recent Labs  Lab 05/05/2020 1514  CKTOTAL 3,270*    HbA1C: No results found for: HGBA1C  CBG: Recent Labs  Lab 04/21/2020 2156  GLUCAP 157*    Review of Systems:   Unable to assess  Past Medical History:  He,  has a past medical history of HIV (human immunodeficiency virus infection) (Terry Boyle) and Seizure disorder (Terry Boyle).   Surgical History:   Past Surgical History:  Procedure Laterality Date  . BIOPSY  06/24/2018   Procedure: BIOPSY;  Surgeon: Terry Juniper, MD;  Location: [place];  Service: Gastroenterology;;  . CRANIECTOMY Left   . ESOPHAGOGASTRODUODENOSCOPY (EGD) WITH PROPOFOL N/A 06/24/2018   Procedure: ESOPHAGOGASTRODUODENOSCOPY (EGD) WITH PROPOFOL;  Surgeon: Terry Juniper, MD;  Location: [place];  Service: Gastroenterology;  Laterality: N/A;     Social History:   reports that  he has been smoking cigarettes. He has been smoking about 1.00 pack per day. He has never used smokeless tobacco. He reports previous alcohol use. He reports previous drug use.   Family History:  His Family history is unknown by patient.   Allergies No Known Allergies   Home Medications  Prior to Admission medications   Medication Sig Start Date End Date Taking? Authorizing Provider  albuterol (PROAIR HFA) 108 (90 Base) MCG/ACT inhaler Inhale into the lungs. 05/07/19  Yes [provider]  ibuprofen (ADVIL) 800 MG tablet Take 1 tablet by mouth every 8 (eight) hours as needed. 08/14/19  Yes [provider]  levETIRAcetam (KEPPRA) 1000 MG tablet Take 1 tablet by mouth 2 (two) times daily. 04/12/18  Yes [provider]  potassium chloride (KLOR-CON) 10 MEQ tablet  Take by mouth. 02/09/18 06/16/20 Yes [provider]  potassium chloride (KLOR-CON) 10 MEQ tablet Take 2 tablets by mouth daily. 08/14/19  Yes [provider]  tenofovir (VIREAD) 300 MG tablet Take by mouth. 04/27/17  Yes [provider]  acetaminophen-codeine (TYLENOL #3) 300-30 MG tablet Take 1 tablet by mouth every 8 (eight) hours as needed for moderate pain. 03/20/19   Fulp, Cammie, MD  albuterol (VENTOLIN HFA) 108 (90 Base) MCG/ACT inhaler 2 puffs every 4-6 hours as needed 07/09/18   Fulp, Cammie, MD  amLODipine (NORVASC) 10 MG tablet Take 1 tablet (10 mg total) by mouth daily. 02/17/20   Ladell Pier, MD  folic acid (FOLVITE) 1 MG tablet Take 1 mg by mouth daily. 11/19/19   [provider]  hyoscyamine (LEVSIN) 0.125 MG tablet TAKE 1 TABLET BY MOUTH EVERY 6 HOURS AS NEEDED FOR CRAMPS 02/17/20   Ladell Pier, MD  ibuprofen (ADVIL) 600 MG tablet Take 1 tablet (600 mg total) by mouth every 8 (eight) hours as needed. 02/17/20   Ladell Pier, MD  levETIRAcetam (KEPPRA) 1000 MG tablet Take 1 tablet (1,000 mg total) by mouth 2 (two) times daily. Please make PCP appointment. Last  fill! 02/17/20   Ladell Pier, MD  lipase/protease/amylase (CREON) 12000-38000 units CPEP capsule Take 1 capsule (12,000 Units total) by mouth 3 (three) times daily with meals. 02/17/20   Ladell Pier, MD  ondansetron (ZOFRAN-ODT) 4 MG disintegrating tablet DISSOLVE 1 TABLET IN MOUTH EVERY 8 HOURS AS NEEDED FOR NAUSEA FOR VOMITING 02/17/20   Ladell Pier, MD  pantoprazole (PROTONIX) 40 MG tablet Take 1 tablet (40 mg total) by mouth daily. 02/17/20   Ladell Pier, MD  polyethylene glycol powder (GLYCOLAX/MIRALAX) 17 GM/SCOOP powder Add 1 scoop (17 gm) of powder to 8 or more ounces of liquid once daily to treat constipation 02/17/20   Ladell Pier, MD  potassium chloride (KLOR-CON) 10 MEQ tablet Take 1 tablet (10 mEq total) by mouth daily. 02/17/20   Ladell Pier, MD  tenofovir (VIREAD) 300 MG tablet Take 1 tablet (300 mg total) by mouth daily. 07/09/18   Fulp, Cammie, MD  tiZANidine (ZANAFLEX) 4 MG tablet TAKE 1 TABLET BY MOUTH AT BEDTIME AS NEEDED FOR MUSCLE SPASM 04/22/19   Antony Blackbird, MD     Critical care time: 94

## 2020-04-27 NOTE — Progress Notes (Signed)
Pharmacy Antibiotic Note  Terry Boyle is a 65 y.o. male admitted on 05/08/2020 with sepsis.  Pharmacy has been consulted for vancomycin and zosyn dosing. Vancomycin 1500 mg and Zosyn 3.375 gm given earlier today  Plan: Cont zosyn 2.25 gm IV q8 hours Will check random vanc level in ~ 3 days to assess for redose F/u renal function, cultures and clinical course  Height: 6\' 1"  (185.4 cm) Weight: 73 kg (160 lb 15 oz) IBW/kg (Calculated) : 79.9  Temp (24hrs), Avg:95.3 F (35.2 C), Min:87.6 F (30.9 C), Max:97.5 F (36.4 C)  Recent Labs  Lab 05/06/2020 1136 04/25/2020 1241 05/08/2020 1951  WBC 10.2  --  12.2*  CREATININE 7.05*  --   --   LATICACIDVEN 6.4* 6.7*  --     Estimated Creatinine Clearance: 11.2 mL/min (A) (by C-G formula based on SCr of 7.05 mg/dL (H)).    No Known Allergies  Thank you for allowing pharmacy to be a part of this patient's care.  Excell Seltzer Poteet 04/16/2020 11:29 PM

## 2020-04-27 NOTE — ED Triage Notes (Signed)
Pt was found on the floor by his neighbor who comes to check on him daily. LKW about this time yesterday per EMS. Pt is completely unresponsive, no response to painful stimuli. BP 97/66, HR 70, O2 sat 100%, CBG 130 on RA for EMS.

## 2020-04-27 NOTE — ED Notes (Signed)
Pt lying flat in bed with bear hugger in place. Pt is unresponsive to any stimuli. Pt vital signs are normal other than temp. Continuing to monitor pt at this time. MD aware of pts status.

## 2020-04-27 NOTE — Progress Notes (Addendum)
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 Hypotensive and hypothermic in the ED. Intubated for airway protection with note of bloody stool output during intubation.  Repeat H/H 12.2/33.3 from 16.8/46. He did receive 3.5 liters of fluid for creatinine 7.05 lactic acid 6.7, CK 3270. Head CT without acute intracranial pathology. CXR without focal consolidation or pulmonary edema. UA 0-5 WBC  BP 98/59  HR 84 on norepinephrine Seen intubated 16/630/60%/5 PEEP. Peak pressure 18, MV 12  eICU Interventions   GI bleed. EGD 06/24/2018 with gastric ulcers, portal hypertensive gastropathy. Will start protonix 40 IV q 12. PT INR pending  Altered sensorium, suspect hepatic encephalopathy triggered by GI bleed. Ammonia level pending. Lactulose if without contraindication.  Intubated for airway protection, may lower tidal volume and FiO2 for lung protection     Intervention Category Major Interventions: Respiratory failure - evaluation and management;Hemorrhage - evaluation and management Evaluation Type: New Patient Evaluation  Judd Lien 04/26/2020, 11:01 PM

## 2020-04-27 NOTE — ED Notes (Signed)
During intubation, pt audibly producing liquid stool. Noted to having moderate bloody stool. Dr. Melina Copa notified. Stat CBC drawn and sent to lab. Results hgb 12.2, Dr. Melina Copa notified.  Lab reports she will have to manually count platelets before resulting.

## 2020-04-27 NOTE — Code Documentation (Signed)
Pt intubated, color change noted.

## 2020-04-27 NOTE — Code Documentation (Signed)
EDP at bedside to intubate.

## 2020-04-28 ENCOUNTER — Inpatient Hospital Stay (HOSPITAL_COMMUNITY): Payer: Medicaid Other

## 2020-04-28 DIAGNOSIS — M6282 Rhabdomyolysis: Secondary | ICD-10-CM | POA: Diagnosis not present

## 2020-04-28 DIAGNOSIS — R4182 Altered mental status, unspecified: Secondary | ICD-10-CM

## 2020-04-28 DIAGNOSIS — K922 Gastrointestinal hemorrhage, unspecified: Secondary | ICD-10-CM | POA: Diagnosis not present

## 2020-04-28 DIAGNOSIS — K7460 Unspecified cirrhosis of liver: Secondary | ICD-10-CM

## 2020-04-28 DIAGNOSIS — R7881 Bacteremia: Secondary | ICD-10-CM | POA: Diagnosis not present

## 2020-04-28 DIAGNOSIS — I10 Essential (primary) hypertension: Secondary | ICD-10-CM | POA: Diagnosis not present

## 2020-04-28 DIAGNOSIS — K921 Melena: Secondary | ICD-10-CM

## 2020-04-28 DIAGNOSIS — B181 Chronic viral hepatitis B without delta-agent: Secondary | ICD-10-CM

## 2020-04-28 DIAGNOSIS — A419 Sepsis, unspecified organism: Secondary | ICD-10-CM | POA: Diagnosis not present

## 2020-04-28 DIAGNOSIS — R6520 Severe sepsis without septic shock: Secondary | ICD-10-CM | POA: Diagnosis not present

## 2020-04-28 LAB — CBC
HCT: 24.3 % — ABNORMAL LOW (ref 39.0–52.0)
Hemoglobin: 8.7 g/dL — ABNORMAL LOW (ref 13.0–17.0)
MCH: 36.7 pg — ABNORMAL HIGH (ref 26.0–34.0)
MCHC: 35.8 g/dL (ref 30.0–36.0)
MCV: 102.5 fL — ABNORMAL HIGH (ref 80.0–100.0)
Platelets: 77 10*3/uL — ABNORMAL LOW (ref 150–400)
RBC: 2.37 MIL/uL — ABNORMAL LOW (ref 4.22–5.81)
RDW: 13.2 % (ref 11.5–15.5)
WBC: 11.9 10*3/uL — ABNORMAL HIGH (ref 4.0–10.5)
nRBC: 0.2 % (ref 0.0–0.2)

## 2020-04-28 LAB — T4, FREE: Free T4: 0.79 ng/dL (ref 0.61–1.12)

## 2020-04-28 LAB — COMPREHENSIVE METABOLIC PANEL
ALT: 1019 U/L — ABNORMAL HIGH (ref 0–44)
ALT: 454 U/L — ABNORMAL HIGH (ref 0–44)
ALT: 911 U/L — ABNORMAL HIGH (ref 0–44)
AST: 1479 U/L — ABNORMAL HIGH (ref 15–41)
AST: 1604 U/L — ABNORMAL HIGH (ref 15–41)
AST: 648 U/L — ABNORMAL HIGH (ref 15–41)
Albumin: 2.6 g/dL — ABNORMAL LOW (ref 3.5–5.0)
Albumin: 3 g/dL — ABNORMAL LOW (ref 3.5–5.0)
Albumin: 3.1 g/dL — ABNORMAL LOW (ref 3.5–5.0)
Alkaline Phosphatase: 71 U/L (ref 38–126)
Alkaline Phosphatase: 72 U/L (ref 38–126)
Alkaline Phosphatase: 74 U/L (ref 38–126)
Anion gap: 15 (ref 5–15)
Anion gap: 17 — ABNORMAL HIGH (ref 5–15)
Anion gap: 19 — ABNORMAL HIGH (ref 5–15)
BUN: 129 mg/dL — ABNORMAL HIGH (ref 8–23)
BUN: 137 mg/dL — ABNORMAL HIGH (ref 8–23)
BUN: 139 mg/dL — ABNORMAL HIGH (ref 8–23)
CO2: 10 mmol/L — ABNORMAL LOW (ref 22–32)
CO2: 12 mmol/L — ABNORMAL LOW (ref 22–32)
CO2: 13 mmol/L — ABNORMAL LOW (ref 22–32)
Calcium: 6.5 mg/dL — ABNORMAL LOW (ref 8.9–10.3)
Calcium: 6.7 mg/dL — ABNORMAL LOW (ref 8.9–10.3)
Calcium: 6.9 mg/dL — ABNORMAL LOW (ref 8.9–10.3)
Chloride: 115 mmol/L — ABNORMAL HIGH (ref 98–111)
Chloride: 115 mmol/L — ABNORMAL HIGH (ref 98–111)
Chloride: 115 mmol/L — ABNORMAL HIGH (ref 98–111)
Creatinine, Ser: 5.59 mg/dL — ABNORMAL HIGH (ref 0.61–1.24)
Creatinine, Ser: 6.12 mg/dL — ABNORMAL HIGH (ref 0.61–1.24)
Creatinine, Ser: 6.21 mg/dL — ABNORMAL HIGH (ref 0.61–1.24)
GFR, Estimated: 10 mL/min — ABNORMAL LOW (ref >60)
GFR, Estimated: 10 mL/min — ABNORMAL LOW (ref >60)
GFR, Estimated: 11 mL/min — ABNORMAL LOW (ref >60)
Glucose, Bld: 124 mg/dL — ABNORMAL HIGH (ref 70–99)
Glucose, Bld: 145 mg/dL — ABNORMAL HIGH (ref 70–99)
Glucose, Bld: 156 mg/dL — ABNORMAL HIGH (ref 70–99)
Potassium: 3.7 mmol/L (ref 3.5–5.1)
Potassium: 4.3 mmol/L (ref 3.5–5.1)
Potassium: 5.1 mmol/L (ref 3.5–5.1)
Sodium: 142 mmol/L (ref 135–145)
Sodium: 144 mmol/L (ref 135–145)
Sodium: 145 mmol/L (ref 135–145)
Total Bilirubin: 1.8 mg/dL — ABNORMAL HIGH (ref 0.3–1.2)
Total Bilirubin: 2.1 mg/dL — ABNORMAL HIGH (ref 0.3–1.2)
Total Bilirubin: 2.1 mg/dL — ABNORMAL HIGH (ref 0.3–1.2)
Total Protein: 4.9 g/dL — ABNORMAL LOW (ref 6.5–8.1)
Total Protein: 5.3 g/dL — ABNORMAL LOW (ref 6.5–8.1)
Total Protein: 5.3 g/dL — ABNORMAL LOW (ref 6.5–8.1)

## 2020-04-28 LAB — PROTIME-INR
INR: 2.1 — ABNORMAL HIGH (ref 0.8–1.2)
Prothrombin Time: 23.8 seconds — ABNORMAL HIGH (ref 11.4–15.2)

## 2020-04-28 LAB — POCT I-STAT 7, (LYTES, BLD GAS, ICA,H+H)
Acid-base deficit: 12 mmol/L — ABNORMAL HIGH (ref 0.0–2.0)
Bicarbonate: 12.8 mmol/L — ABNORMAL LOW (ref 20.0–28.0)
Calcium, Ion: 0.93 mmol/L — ABNORMAL LOW (ref 1.15–1.40)
HCT: 33 % — ABNORMAL LOW (ref 39.0–52.0)
Hemoglobin: 11.2 g/dL — ABNORMAL LOW (ref 13.0–17.0)
O2 Saturation: 100 %
Patient temperature: 35.2
Potassium: 4.9 mmol/L (ref 3.5–5.1)
Sodium: 140 mmol/L (ref 135–145)
TCO2: 14 mmol/L — ABNORMAL LOW (ref 22–32)
pCO2 arterial: 23.1 mmHg — ABNORMAL LOW (ref 32.0–48.0)
pH, Arterial: 7.342 — ABNORMAL LOW (ref 7.350–7.450)
pO2, Arterial: 256 mmHg — ABNORMAL HIGH (ref 83.0–108.0)

## 2020-04-28 LAB — ECHOCARDIOGRAM COMPLETE
Area-P 1/2: 4.49 cm2
Calc EF: 75.8 %
Height: 73 in
S' Lateral: 3 cm
Single Plane A2C EF: 65.3 %
Single Plane A4C EF: 84.1 %
Weight: 2567.92 oz

## 2020-04-28 LAB — CBC WITH DIFFERENTIAL/PLATELET
Abs Immature Granulocytes: 0.07 10*3/uL (ref 0.00–0.07)
Basophils Absolute: 0 10*3/uL (ref 0.0–0.1)
Basophils Relative: 0 %
Eosinophils Absolute: 0 10*3/uL (ref 0.0–0.5)
Eosinophils Relative: 0 %
HCT: 27.3 % — ABNORMAL LOW (ref 39.0–52.0)
Hemoglobin: 9.7 g/dL — ABNORMAL LOW (ref 13.0–17.0)
Immature Granulocytes: 1 %
Lymphocytes Relative: 2 %
Lymphs Abs: 0.3 10*3/uL — ABNORMAL LOW (ref 0.7–4.0)
MCH: 37 pg — ABNORMAL HIGH (ref 26.0–34.0)
MCHC: 35.5 g/dL (ref 30.0–36.0)
MCV: 104.2 fL — ABNORMAL HIGH (ref 80.0–100.0)
Monocytes Absolute: 1.8 10*3/uL — ABNORMAL HIGH (ref 0.1–1.0)
Monocytes Relative: 13 %
Neutro Abs: 11.3 10*3/uL — ABNORMAL HIGH (ref 1.7–7.7)
Neutrophils Relative %: 84 %
Platelets: 106 10*3/uL — ABNORMAL LOW (ref 150–400)
RBC: 2.62 MIL/uL — ABNORMAL LOW (ref 4.22–5.81)
RDW: 13.2 % (ref 11.5–15.5)
WBC: 13.4 10*3/uL — ABNORMAL HIGH (ref 4.0–10.5)
nRBC: 0 % (ref 0.0–0.2)

## 2020-04-28 LAB — ETHANOL: Alcohol, Ethyl (B): <10 mg/dL (ref <10)

## 2020-04-28 LAB — DIC (DISSEMINATED INTRAVASCULAR COAGULATION)PANEL
D-Dimer, Quant: 5.61 ug/mL-FEU — ABNORMAL HIGH (ref 0.00–0.50)
Fibrinogen: 206 mg/dL — ABNORMAL LOW (ref 210–475)
INR: 2.5 — ABNORMAL HIGH (ref 0.8–1.2)
Platelets: 77 10*3/uL — ABNORMAL LOW (ref 150–400)
Prothrombin Time: 26.7 seconds — ABNORMAL HIGH (ref 11.4–15.2)
Smear Review: NONE SEEN
aPTT: 34 seconds (ref 24–36)

## 2020-04-28 LAB — MAGNESIUM
Magnesium: 2.9 mg/dL — ABNORMAL HIGH (ref 1.7–2.4)
Magnesium: 3 mg/dL — ABNORMAL HIGH (ref 1.7–2.4)

## 2020-04-28 LAB — GLUCOSE, CAPILLARY
Glucose-Capillary: 125 mg/dL — ABNORMAL HIGH (ref 70–99)
Glucose-Capillary: 101 mg/dL — ABNORMAL HIGH (ref 70–99)
Glucose-Capillary: 126 mg/dL — ABNORMAL HIGH (ref 70–99)
Glucose-Capillary: 146 mg/dL — ABNORMAL HIGH (ref 70–99)
Glucose-Capillary: 233 mg/dL — ABNORMAL HIGH (ref 70–99)
Glucose-Capillary: 173 mg/dL — ABNORMAL HIGH (ref 70–99)

## 2020-04-28 LAB — PROCALCITONIN
Procalcitonin: 2.06 ng/mL
Procalcitonin: 2.22 ng/mL

## 2020-04-28 LAB — TYPE AND SCREEN
ABO/RH(D): O POS
Antibody Screen: NEGATIVE

## 2020-04-28 LAB — CK
Total CK: 1702 U/L — ABNORMAL HIGH (ref 49–397)
Total CK: 1955 U/L — ABNORMAL HIGH (ref 49–397)
Total CK: 2417 U/L — ABNORMAL HIGH (ref 49–397)

## 2020-04-28 LAB — ABO/RH: ABO/RH(D): O POS

## 2020-04-28 LAB — LACTIC ACID, PLASMA
Lactic Acid, Venous: 5 mmol/L (ref 0.5–1.9)
Lactic Acid, Venous: 5.2 mmol/L (ref 0.5–1.9)

## 2020-04-28 LAB — FOLATE: Folate: 18.4 ng/mL (ref >5.9)

## 2020-04-28 LAB — VITAMIN B12: Vitamin B-12: 6620 pg/mL — ABNORMAL HIGH (ref 180–914)

## 2020-04-28 LAB — PHOSPHORUS: Phosphorus: 6.7 mg/dL — ABNORMAL HIGH (ref 2.5–4.6)

## 2020-04-28 LAB — HEMOGLOBIN A1C
Hgb A1c MFr Bld: 5.5 % (ref 4.8–5.6)
Mean Plasma Glucose: 111.15 mg/dL

## 2020-04-28 LAB — AMMONIA: Ammonia: 664 umol/L — ABNORMAL HIGH (ref 9–35)

## 2020-04-28 LAB — LIPASE, BLOOD: Lipase: 133 U/L — ABNORMAL HIGH (ref 11–51)

## 2020-04-28 MED ORDER — SODIUM CHLORIDE 0.9 % IV BOLUS
500.0000 mL | Freq: Once | INTRAVENOUS | Status: AC
  Administered 2020-04-28: 500 mL via INTRAVENOUS

## 2020-04-28 MED ORDER — POLYETHYLENE GLYCOL 3350 17 G PO PACK: 17.0000 g | PACK | Freq: Every day | ORAL | Status: DC

## 2020-04-28 MED ORDER — ORAL CARE MOUTH RINSE
15.0000 mL | OROMUCOSAL | Status: DC
  Administered 2020-04-28 – 2020-05-07 (×90): 15 mL via OROMUCOSAL

## 2020-04-28 MED ORDER — INSULIN ASPART 100 UNIT/ML ~~LOC~~ SOLN
0.0000 [IU] | Freq: Three times a day (TID) | SUBCUTANEOUS | Status: DC
  Administered 2020-04-29 (×2): 1 [IU] via SUBCUTANEOUS

## 2020-04-28 MED ORDER — SODIUM CHLORIDE 0.9 % IV SOLN
50.0000 ug/h | INTRAVENOUS | Status: DC
  Administered 2020-04-28 – 2020-04-30 (×6): 50 ug/h via INTRAVENOUS
  Filled 2020-04-28 (×7): qty 1

## 2020-04-28 MED ORDER — LACTATED RINGERS IV BOLUS
1000.0000 mL | Freq: Once | INTRAVENOUS | Status: AC
  Administered 2020-04-28: 1000 mL via INTRAVENOUS

## 2020-04-28 MED ORDER — ALBUMIN HUMAN 25 % IV SOLN
50.0000 g | Freq: Once | INTRAVENOUS | Status: AC
  Administered 2020-04-28: 50 g via INTRAVENOUS
  Filled 2020-04-28: qty 200

## 2020-04-28 MED ORDER — SODIUM BICARBONATE 8.4 % IV SOLN
100.0000 meq | Freq: Once | INTRAVENOUS | Status: AC
  Administered 2020-04-28: 100 meq via INTRAVENOUS
  Filled 2020-04-28: qty 100

## 2020-04-28 MED ORDER — SODIUM CHLORIDE 0.9% IV SOLUTION: Freq: Once | INTRAVENOUS | Status: AC

## 2020-04-28 MED ORDER — MIDAZOLAM HCL 2 MG/2ML IJ SOLN: 2.0000 mg | INTRAMUSCULAR | Status: DC | PRN

## 2020-04-28 MED ORDER — SODIUM CHLORIDE 0.45 % IV BOLUS
1000.0000 mL | Freq: Once | INTRAVENOUS | Status: AC
  Administered 2020-04-28: 1000 mL via INTRAVENOUS

## 2020-04-28 MED ORDER — PROPOFOL 1000 MG/100ML IV EMUL
0.0000 ug/kg/min | INTRAVENOUS | Status: DC
  Administered 2020-04-28: 25 ug/kg/min via INTRAVENOUS
  Administered 2020-04-28: 20 ug/kg/min via INTRAVENOUS
  Filled 2020-04-28 (×2): qty 100

## 2020-04-28 MED ORDER — NOREPINEPHRINE 4 MG/250ML-% IV SOLN
0.0000 ug/min | INTRAVENOUS | Status: DC
  Administered 2020-04-28 – 2020-04-29 (×2): 5 ug/min via INTRAVENOUS
  Administered 2020-04-30: 2 ug/min via INTRAVENOUS
  Filled 2020-04-28 (×3): qty 250

## 2020-04-28 MED ORDER — SODIUM BICARBONATE 8.4 % IV SOLN
50.0000 meq | Freq: Once | INTRAVENOUS | Status: AC
  Administered 2020-04-28: 50 meq via INTRAVENOUS
  Filled 2020-04-28: qty 50

## 2020-04-28 MED ORDER — FENTANYL CITRATE (PF) 100 MCG/2ML IJ SOLN
100.0000 ug | Freq: Once | INTRAMUSCULAR | Status: AC
  Administered 2020-04-28: 100 ug via INTRAVENOUS

## 2020-04-28 MED ORDER — CALCIUM GLUCONATE-NACL 2-0.675 GM/100ML-% IV SOLN
2.0000 g | Freq: Once | INTRAVENOUS | Status: AC
  Administered 2020-04-28: 2000 mg via INTRAVENOUS
  Filled 2020-04-28: qty 100

## 2020-04-28 MED ORDER — VASOPRESSIN 20 UNITS/100 ML INFUSION FOR SHOCK
0.0000 [IU]/min | INTRAVENOUS | Status: DC
  Administered 2020-04-28 (×3): 0.03 [IU]/min via INTRAVENOUS
  Filled 2020-04-28 (×3): qty 100

## 2020-04-28 MED ORDER — DOCUSATE SODIUM 50 MG/5ML PO LIQD: 100.0000 mg | Freq: Two times a day (BID) | ORAL | Status: DC | PRN

## 2020-04-28 MED ORDER — CHLORHEXIDINE GLUCONATE 0.12% ORAL RINSE (MEDLINE KIT)
15.0000 mL | Freq: Two times a day (BID) | OROMUCOSAL | Status: DC
  Administered 2020-04-28 – 2020-05-07 (×18): 15 mL via OROMUCOSAL

## 2020-04-28 MED ORDER — FENTANYL CITRATE (PF) 100 MCG/2ML IJ SOLN
INTRAMUSCULAR | Status: AC
  Filled 2020-04-28: qty 2

## 2020-04-28 MED ORDER — DOCUSATE SODIUM 50 MG/5ML PO LIQD: 100.0000 mg | Freq: Two times a day (BID) | ORAL | Status: DC

## 2020-04-28 MED ORDER — POLYETHYLENE GLYCOL 3350 17 G PO PACK: 17.0000 g | PACK | Freq: Every day | ORAL | Status: DC | PRN

## 2020-04-28 MED ORDER — PROPOFOL 1000 MG/100ML IV EMUL
5.0000 ug/kg/min | INTRAVENOUS | Status: DC
Start: 2020-04-28 — End: 2020-04-28

## 2020-04-28 MED ORDER — MIDAZOLAM HCL 2 MG/2ML IJ SOLN
2.0000 mg | INTRAMUSCULAR | Status: DC | PRN
  Administered 2020-04-28: 2 mg via INTRAVENOUS

## 2020-04-28 MED ORDER — MIDAZOLAM HCL 2 MG/2ML IJ SOLN
INTRAMUSCULAR | Status: AC
  Filled 2020-04-28: qty 2

## 2020-04-28 MED ORDER — FENTANYL 2500MCG IN NS 250ML (10MCG/ML) PREMIX INFUSION
0.0000 ug/h | INTRAVENOUS | Status: DC
  Administered 2020-04-28: 400 ug/h via INTRAVENOUS
  Administered 2020-04-28: 50 ug/h via INTRAVENOUS
  Administered 2020-04-28: 400 ug/h via INTRAVENOUS
  Administered 2020-04-29 – 2020-04-30 (×3): 200 ug/h via INTRAVENOUS
  Filled 2020-04-28 (×6): qty 250

## 2020-04-28 MED ORDER — FENTANYL BOLUS VIA INFUSION
100.0000 ug | INTRAVENOUS | Status: DC | PRN
  Administered 2020-04-28 (×2): 100 ug via INTRAVENOUS
  Filled 2020-04-28: qty 100

## 2020-04-28 MED ORDER — SODIUM CHLORIDE 0.45 % IV SOLN: INTRAVENOUS | Status: DC

## 2020-04-28 MED ORDER — LEVETIRACETAM 100 MG/ML PO SOLN
500.0000 mg | Freq: Every day | ORAL | Status: DC
  Administered 2020-04-28 – 2020-04-29 (×2): 500 mg
  Filled 2020-04-28: qty 5

## 2020-04-28 MED ORDER — SODIUM CHLORIDE 0.9 % IV BOLUS
1000.0000 mL | Freq: Once | INTRAVENOUS | Status: AC
  Administered 2020-04-28: 1000 mL via INTRAVENOUS

## 2020-04-28 MED ORDER — LEVETIRACETAM 100 MG/ML PO SOLN
1000.0000 mg | Freq: Two times a day (BID) | ORAL | Status: DC
  Filled 2020-04-28: qty 10

## 2020-04-28 MED ORDER — VITAMIN K1 10 MG/ML IJ SOLN
10.0000 mg | Freq: Once | INTRAVENOUS | Status: AC
  Administered 2020-04-28: 10 mg via INTRAVENOUS
  Filled 2020-04-28: qty 1

## 2020-04-28 NOTE — Progress Notes (Signed)
[facility] Progress Note Patient Name: Terry Boyle DOB: [birth year] MRN: [id number]   Date of Service  04/28/2020  HPI/Events of Note  Received request for type and screen. Also unable to get consent from family regarding blood transfusion. INR 2.1 and patient still with BRBPR  eICU Interventions  Type and screen ordered  Given the urgent nature of the need to transfuse as patient still actively bleeding Dr Duwayne Heck (ordering physician) and myself will provide emergency consent for transfusion of FFP.     Intervention Category Intermediate Interventions: Coagulopathy - evaluation and management  Judd Lien 04/28/2020, 2:29 AM

## 2020-04-28 NOTE — Procedures (Signed)
Patient Name: Terry Boyle  MRN: [id number]  Epilepsy Attending: Lora Havens  Referring Physician/Provider: Hayden Pedro, NP Date: 04/28/2020 Duration: 35.27 mins  Patient history: 65yo M with ams. EEG to evaluate for seizure  Level of alertness:  comatose  AEDs during EEG study: LEV, versed  Technical aspects: This EEG study was done with scalp electrodes positioned according to the 10-20 International system of electrode placement. Electrical activity was acquired at a sampling rate of 500Hz  and reviewed with a high frequency filter of 70Hz  and a low frequency filter of 1Hz . EEG data were recorded continuously and digitally stored.   Description: EEG showed continuous generalized low amplitude 3 to 6 Hz theta-delta slowing. Physiologic photic driving was not seen during photic stimulation.  Hyperventilation was not performed.     ABNORMALITY - Continuous slow, generalized  IMPRESSION: This study is suggestive of severe diffuse encephalopathy, nonspecific etiology. No seizures or epileptiform discharges were seen throughout the recording.  [name] Barbra Sarks

## 2020-04-28 NOTE — Progress Notes (Signed)
  Echocardiogram 2D Echocardiogram has been performed.  Bobbye Charleston 04/28/2020, 9:13 AM

## 2020-04-28 NOTE — Consult Note (Addendum)
[facility] Consult: 2:43 PM 04/28/2020  LOS: 1 day    Referring Provider: Dr Silas Flood CCM  Primary Care Physician:  Charlott Rakes, MD Primary Gastroenterologist:  [facility]    Reason for Consultation: Hematochezia.   HPI: Terry Boyle is a 65 y.o. male.  PMH seizures.  S/p craniotomy.  Cirrhosis of the liver.  Chronic hepatitis B, ID is monitoring but does not feel he needs treatment as of 07/2019, hep B DNA less than 10 IU in 3/202.  Pancreatitis.  Substance abuse Follows with infectious disease.  06/2018 EGD for CGE, melena, cirrhosis, blood loss anemia..  Severe portal hypertensive gastropathy.  Clean-based, nonbleeding gastric ulcers, biopsied.  Normal esophagus and examined duodenum.  Regular Z-line.  Pathology showed mild, chronic gastritis, no H. pylori or intestinal metaplasia. 05/2019 ultrasound abdomen with elastography: Regenia's liver texture without focal mass. Normal portal vein Dopplers.   Elastography parameters: Median kPa 2.37.  IQ R.32.  IQR/median kPa ratio 0.13.  Diagnostic category: < or = to 5 k Pa is high probability of being normal.  Listed Home meds include but not limited to ibuprofen 600 mg every 8 hours Rx'd 03/12/2020, Protonix 40 daily,Creon, tenofovir.  However its not clear which of these meds he is actually taking.  Neighbor checks on pt daily and last seen normal in the evening of 4/17, on 4/18 in the afternoon he was found down, unresponsive, hypothermic.  Ruled in for rhabdomyolysis. Intubated, started on pressors, OG tube placed. After intubation had an bloody stool and moderate blood per rectum noted at arrival to ICU later that day. T bili 1.8.  Alk phos 74.  AST/ALT 1479/911. CK 3270 >> 1955.   GFR 10.  BUN/creatinine 137/6.2. INR 2.5. Hb 8.7.  Platelets 77K.  MCV  104. CT head with left temporal lobe encephalomalacia but no intracranial pathology. Abdominal ultrasound limited: Cirrhotic liver, portal venous hypertension.  PV difficult to visualize with probable reversal of flow in the PV versus thrombus.  Gallbladder sludge without cholecystitis or cholelithiasis.  Current meds include octreotide, PPI drips.  Vasopressin, propofol, Levophed, fentanyl drips.  Received 50 g IV albumin infusion.  IV vancomycin and Zosyn in place. Received 2 FFP thus far.  Consumption of alcohol or illicit substances unknown.  Past Medical History:  Diagnosis Date  . HIV (human immunodeficiency virus infection) ([place])   . Seizure disorder [facility])     Past Surgical History:  Procedure Laterality Date  . BIOPSY  06/24/2018   Procedure: BIOPSY;  Surgeon: Ronnette Juniper, MD;  Location: [place];  Service: Gastroenterology;;  . CRANIECTOMY Left   . ESOPHAGOGASTRODUODENOSCOPY (EGD) WITH PROPOFOL N/A 06/24/2018   Procedure: ESOPHAGOGASTRODUODENOSCOPY (EGD) WITH PROPOFOL;  Surgeon: Ronnette Juniper, MD;  Location: [place];  Service: Gastroenterology;  Laterality: N/A;    Prior to Admission medications   Medication Sig Start Date End Date Taking? Authorizing Provider  albuterol (PROAIR HFA) 108 (90 Base) MCG/ACT inhaler Inhale into the lungs. 05/07/19  Yes [provider]  ibuprofen (ADVIL) 800 MG tablet Take 1 tablet by mouth every 8 (eight)  hours as needed. 08/14/19  Yes [provider]  acetaminophen-codeine (TYLENOL #3) 300-30 MG tablet Take 1 tablet by mouth every 8 (eight) hours as needed for moderate pain. 03/20/19   Fulp, Cammie, MD  albuterol (VENTOLIN HFA) 108 (90 Base) MCG/ACT inhaler 2 puffs every 4-6 hours as needed 07/09/18   Fulp, Cammie, MD  amLODipine (NORVASC) 10 MG tablet Take 1 tablet (10 mg total) by mouth daily. 02/17/20   Ladell Pier, MD  folic acid (FOLVITE) 1 MG tablet Take 1 mg by mouth daily. 11/19/19   [provider]   hyoscyamine (LEVSIN) 0.125 MG tablet TAKE 1 TABLET BY MOUTH EVERY 6 HOURS AS NEEDED FOR CRAMPS 02/17/20   Ladell Pier, MD  ibuprofen (ADVIL) 600 MG tablet Take 1 tablet (600 mg total) by mouth every 8 (eight) hours as needed. 02/17/20   Ladell Pier, MD  levETIRAcetam (KEPPRA) 1000 MG tablet Take 1 tablet (1,000 mg total) by mouth 2 (two) times daily. Please make PCP appointment. Last fill! 02/17/20   Ladell Pier, MD  lipase/protease/amylase (CREON) 12000-38000 units CPEP capsule Take 1 capsule (12,000 Units total) by mouth 3 (three) times daily with meals. 02/17/20   Ladell Pier, MD  ondansetron (ZOFRAN-ODT) 4 MG disintegrating tablet DISSOLVE 1 TABLET IN MOUTH EVERY 8 HOURS AS NEEDED FOR NAUSEA FOR VOMITING 02/17/20   Ladell Pier, MD  pantoprazole (PROTONIX) 40 MG tablet Take 1 tablet (40 mg total) by mouth daily. 02/17/20   Ladell Pier, MD  polyethylene glycol powder (GLYCOLAX/MIRALAX) 17 GM/SCOOP powder Add 1 scoop (17 gm) of powder to 8 or more ounces of liquid once daily to treat constipation 02/17/20   Ladell Pier, MD  potassium chloride (KLOR-CON) 10 MEQ tablet Take 1 tablet (10 mEq total) by mouth daily. 02/17/20   Ladell Pier, MD  tenofovir (VIREAD) 300 MG tablet Take 1 tablet (300 mg total) by mouth daily. 07/09/18   Fulp, Cammie, MD  tiZANidine (ZANAFLEX) 4 MG tablet TAKE 1 TABLET BY MOUTH AT BEDTIME AS NEEDED FOR MUSCLE SPASM 04/22/19   Fulp, Cammie, MD    Scheduled Meds: . chlorhexidine gluconate (MEDLINE KIT)  15 mL Mouth Rinse BID  . Chlorhexidine Gluconate Cloth  6 each Topical Q0600  . fentaNYL      . insulin aspart  0-6 Units Subcutaneous TID WC  . levETIRAcetam  500 mg Per Tube Daily  . mouth rinse  15 mL Mouth Rinse 10 times per day  . midazolam      . [START ON 05/01/2020] pantoprazole  40 mg Intravenous Q12H   Infusions: . sodium chloride    . fentaNYL infusion INTRAVENOUS 400 mcg/hr (04/28/20 1400)  . norepinephrine (LEVOPHED)  Adult infusion 3 mcg/min (04/28/20 1400)  . octreotide  (SANDOSTATIN)    IV infusion 50 mcg/hr (04/28/20 1400)  . pantoprozole (PROTONIX) infusion 8 mg/hr (04/28/20 1400)  . phytonadione (VITAMIN K) IV    . piperacillin-tazobactam (ZOSYN)  IV Stopped (04/28/20 0926)  . propofol (DIPRIVAN) infusion 50 mcg/kg/min (04/28/20 1400)  . vasopressin 0.03 Units/min (04/28/20 1400)   PRN Meds: docusate, fentaNYL, midazolam, midazolam, polyethylene glycol   Allergies as of 05/05/2020  . (No Known Allergies)    Family History  Family history unknown: Yes    Social History   Socioeconomic History  . Marital status: Divorced    Spouse name: Not on file  . Number of children: Not on file  . Years of education: Not on file  .  Highest education level: Not on file  Occupational History  . Not on file  Tobacco Use  . Smoking status: Current Every Day Smoker    Packs/day: 1.00    Types: Cigarettes  . Smokeless tobacco: Never Used  Vaping Use  . Vaping Use: Never used  Substance and Sexual Activity  . Alcohol use: Not Currently  . Drug use: Not Currently  . Sexual activity: Not on file  Other Topics Concern  . Not on file  Social History Narrative  . Not on file   Social Determinants of Health   Financial Resource Strain: Not on file  Food Insecurity: Not on file  Transportation Needs: Not on file  Physical Activity: Not on file  Stress: Not on file  Social Connections: Not on file  Intimate Partner Violence: Not on file    REVIEW OF SYSTEMS: Unable to obtain review of systems as patient is intubated, unresponsive   PHYSICAL EXAM: Vital signs in last 24 hours: Vitals:   04/28/20 1415 04/28/20 1430  BP: (!) 101/50 (!) 98/53  Pulse: 86 87  Resp: 10 (!) 32  Temp: 97.7 F (36.5 C) 97.7 F (36.5 C)  SpO2: 96% 100%   Wt Readings from Last 3 Encounters:  04/28/20 72.8 kg  08/15/19 73 kg  03/29/19 77.1 kg    General: Intubated, unresponsive, thin and  ill-appearing. Head: No signs of head trauma.  No facial asymmetry Eyes: No scleral icterus.  No conjunctival pallor Ears: Not able to assess hearing acuity Nose: No congestion or discharge Mouth: Poor dentition.  Intubated.  Mucosa moist, pink, clear.  No blood visible Neck: No JVD, masses, thyromegaly Lungs: Nonlabored breathing on the vent Heart: RRR.  No MRG.  S1, S2 present Abdomen: Soft, nondistended.  Active bowel sounds.  No response to palpation..   Rectal: Deferred Musc/Skeltl: No obvious joint deformities Extremities: No CCE. Neurologic: Unable to assess. Skin: No telangiectasia     Intake/Output from previous day: 04/18 0701 - 04/19 0700 In: 4573.4 [I.V.:1225.1; Blood:132; IV Piggyback:3216.3] Out: -  Intake/Output this shift: Total I/O In: 4823.6 [I.V.:1068.8; Blood:128; NG/GT:35; IV Piggyback:3591.8] Out: 800 [Urine:800]  LAB RESULTS: Recent Labs    05/08/2020 1951 05/04/2020 2348 04/28/20 0012 04/28/20 1121  WBC 12.2* 13.4*  --  11.9*  HGB 12.2* 9.7* 11.2* 8.7*  HCT 33.3* 27.3* 33.0* 24.3*  PLT 99* 106*  --  77*  77*   BMET Lab Results  Component Value Date   NA 144 04/28/2020   NA 140 04/28/2020   NA 142 05/04/2020   K 4.3 04/28/2020   K 4.9 04/28/2020   K 5.1 04/19/2020   CL 115 (H) 04/28/2020   CL 115 (H) 05/05/2020   CL 99 05/06/2020   CO2 10 (L) 04/28/2020   CO2 12 (L) 04/22/2020   CO2 13 (L) 04/24/2020   GLUCOSE 124 (H) 04/28/2020   GLUCOSE 145 (H) 04/15/2020   GLUCOSE 141 (H) 05/06/2020   BUN 137 (H) 04/28/2020   BUN 139 (H) 04/14/2020   BUN 146 (H) 04/17/2020   CREATININE 6.21 (H) 04/28/2020   CREATININE 6.12 (H) 04/13/2020   CREATININE 7.05 (H) 04/28/2020   CALCIUM 6.7 (L) 04/28/2020   CALCIUM 6.9 (L) 04/30/2020   CALCIUM 9.1 04/12/2020   LFT Recent Labs    04/11/2020 1136 04/18/2020 2348 04/28/20 1121  PROT 8.8* 5.3* 5.3*  ALBUMIN 4.4 2.6* 3.1*  AST 167* 648* 1,479*  ALT 107* 454* 911*  ALKPHOS 118 74 72  BILITOT 3.0*  1.8*  2.1*   PT/INR Lab Results  Component Value Date   INR 2.5 (H) 04/28/2020   INR 2.1 (H) 04/16/2020   INR 1.1 12/25/2018   Hepatitis Panel No results for input(s): HEPBSAG, HCVAB, HEPAIGM, HEPBIGM in the last 72 hours. C-Diff No components found for: CDIFF Lipase     Component Value Date/Time   LIPASE 133 (H) 04/24/2020 2348    Drugs of Abuse     Component Value Date/Time   LABOPIA NONE DETECTED 04/12/2020 1100   COCAINSCRNUR NONE DETECTED 05/06/2020 1100   LABBENZ NONE DETECTED 04/20/2020 1100   AMPHETMU NONE DETECTED 04/15/2020 1100   THCU POSITIVE (A) 04/22/2020 1100   LABBARB NONE DETECTED 04/21/2020 1100     RADIOLOGY STUDIES: CT Head Wo Contrast  Result Date: 04/13/2020 CLINICAL DATA:  Mental status change. Unknown cause. Patient found unresponsive. EXAM: CT HEAD WITHOUT CONTRAST TECHNIQUE: Contiguous axial images were obtained from the base of the skull through the vertex without intravenous contrast. COMPARISON:  None. FINDINGS: Brain: No evidence of acute infarction, hemorrhage, hydrocephalus, extra-axial collection or mass lesion/mass effect. Left temporoparietal lobe encephalomalacia. Vascular: No hyperdense vessel or unexpected calcification. Skull: Prior left parietal craniectomy. No acute osseous abnormality. Sinuses/Orbits: Visualized paranasal sinuses are clear. Visualized mastoid sinuses are clear. Visualized orbits demonstrate no focal abnormality. Other: None IMPRESSION: 1. No acute intracranial pathology. 2. Left temporoparietal lobe encephalomalacia. Electronically Signed   By: Kathreen Devoid   On: 05/01/2020 12:28   DG CHEST PORT 1 VIEW  Result Date: 04/28/2020 CLINICAL DATA:  Underlying placement. EXAM: PORTABLE CHEST 1 VIEW COMPARISON:  One-view chest x-ray 04/19/2020 FINDINGS: The heart size is normal. Endotracheal tube is stable. New left IJ sheath is in place. The tip is in the innominate vein. Mild pulmonary vascular congestion is present. No edema or  effusion is present. No focal airspace is present. Lung volumes are low. IMPRESSION: 1. New left IJ sheath with the tip in the innominate vein. 2. Stable endotracheal tube. 3. Mild pulmonary vascular congestion. Electronically Signed   By: San Morelle M.D.   On: 04/28/2020 09:02   DG Chest Port 1 View  Result Date: 05/06/2020 CLINICAL DATA:  65 year old male with history of altered mental status. Unresponsive patient. EXAM: PORTABLE CHEST 1 VIEW COMPARISON:  Chest x-ray 06/25/2019. FINDINGS: Lung volumes are low. No consolidative airspace disease. No pleural effusions. No pneumothorax. No pulmonary nodule or mass noted. Pulmonary vasculature and the cardiomediastinal silhouette are within normal limits. Atherosclerosis in the thoracic aorta. IMPRESSION: 1. Low lung volumes without radiographic evidence of acute cardiopulmonary disease. 2. Aortic atherosclerosis. Electronically Signed   By: Vinnie Langton M.D.   On: 04/12/2020 11:36   DG Chest Port 1V same Day  Result Date: 04/19/2020 CLINICAL DATA:  Intubation EXAM: PORTABLE CHEST 1 VIEW COMPARISON:  04/17/2020 FINDINGS: Endotracheal tube tip is just below the clavicular heads. No focal airspace consolidation or pulmonary edema. Cardiomediastinal contours are normal. IMPRESSION: Endotracheal tube tip just below the clavicular heads. Electronically Signed   By: Ulyses Jarred M.D.   On: 05/09/2020 19:59   DG Abd Portable 1V  Result Date: 04/28/2020 CLINICAL DATA:  OG tube placement. EXAM: PORTABLE ABDOMEN - 1 VIEW COMPARISON:  CT abdomen and pelvis 06/25/2019 FINDINGS: An enteric tube terminates in the left upper quadrant with tip in the expected region of the gastric body and side hole also below the diaphragm. No dilated loops of bowel are seen to suggest obstruction. There is moderate lumbar levoscoliosis with advanced right-sided  disc degeneration at L2-3. The lung bases were better evaluated on today's chest radiograph. IMPRESSION: Enteric  tube placement as above. Electronically Signed   By: Logan Bores M.D.   On: 04/28/2020 10:18   ECHOCARDIOGRAM COMPLETE  Result Date: 04/28/2020    ECHOCARDIOGRAM REPORT   Patient Name:   IVEN EARNHART Date of Exam: 04/28/2020 Medical Rec #:  [id number]    Height:       73.0 in Accession #:    [id number]   Weight:       160.5 lb Date of Birth:  04/25/1955   BSA:          1.960 m Patient Age:    71 years     BP:           96/55 mmHg Patient Gender: M            HR:           85 bpm. Exam Location:  Inpatient Procedure: 2D Echo, Cardiac Doppler and Color Doppler Indications:    I50.40* Unspecified combined systolic (congestive) and diastolic                 (congestive) heart failure  History:        Patient has no prior history of Echocardiogram examinations.                 Signs/Symptoms:Bacteremia; Risk Factors:Hypertension and Current                 Smoker. Marijuana use.  Sonographer:    Roseanna Rainbow RDCS Referring Phys: [id number] [facility]  Sonographer Comments: Technically difficult study due to poor echo windows and echo performed with patient supine and on artificial respirator. Image acquisition challenging due to respiratory motion. Patient supine, could not turn. IMPRESSIONS  1. Left ventricular ejection fraction, by estimation, is >75%. The left ventricle has hyperdynamic function. The left ventricle has no regional wall motion abnormalities. Left ventricular diastolic parameters were normal.  2. Right ventricular systolic function is hyperdynamic. The right ventricular size is normal.  3. The mitral valve is normal in structure. Mild mitral valve regurgitation. No evidence of mitral stenosis.  4. The aortic valve is normal in structure. Aortic valve regurgitation is not visualized. No aortic stenosis is present.  5. The inferior vena cava is normal in size with greater than 50% respiratory variability, suggesting right atrial pressure of 3 mmHg. Conclusion(s)/Recommendation(s): No evidence of  valvular vegetations on this transthoracic echocardiogram. Would recommend a transesophageal echocardiogram to exclude infective endocarditis if clinically indicated. FINDINGS  Left Ventricle: Left ventricular ejection fraction, by estimation, is >75%. The left ventricle has hyperdynamic function. The left ventricle has no regional wall motion abnormalities. The left ventricular internal cavity size was normal in size. There is no left ventricular hypertrophy. Left ventricular diastolic parameters were normal. Normal left ventricular filling pressure. Right Ventricle: The right ventricular size is normal. No increase in right ventricular wall thickness. Right ventricular systolic function is hyperdynamic. Left Atrium: Left atrial size was normal in size. Right Atrium: Right atrial size was normal in size. Pericardium: There is no evidence of pericardial effusion. Mitral Valve: The mitral valve is normal in structure. Mild mitral valve regurgitation. No evidence of mitral valve stenosis. Tricuspid Valve: The tricuspid valve is normal in structure. Tricuspid valve regurgitation is not demonstrated. No evidence of tricuspid stenosis. Aortic Valve: The aortic valve is normal in structure. Aortic valve regurgitation is not visualized. No aortic stenosis is present. Pulmonic  Valve: The pulmonic valve was normal in structure. Pulmonic valve regurgitation is not visualized. No evidence of pulmonic stenosis. Aorta: The aortic root is normal in size and structure. Venous: The inferior vena cava is normal in size with greater than 50% respiratory variability, suggesting right atrial pressure of 3 mmHg. IAS/Shunts: No atrial level shunt detected by color flow Doppler.  LEFT VENTRICLE PLAX 2D LVIDd:         4.90 cm     Diastology LVIDs:         3.00 cm     LV e' medial:    10.10 cm/s LV PW:         1.20 cm     LV E/e' medial:  7.2 LV IVS:        1.30 cm     LV e' lateral:   8.70 cm/s LVOT diam:     2.10 cm     LV E/e' lateral:  8.4 LV SV:         121 LV SV Index:   62 LVOT Area:     3.46 cm  LV Volumes (MOD) LV vol d, MOD A2C: 52.5 ml LV vol d, MOD A4C: 59.7 ml LV vol s, MOD A2C: 18.2 ml LV vol s, MOD A4C: 9.5 ml LV SV MOD A2C:     34.3 ml LV SV MOD A4C:     59.7 ml LV SV MOD BP:      42.4 ml RIGHT VENTRICLE             IVC RV S prime:     24.40 cm/s  IVC diam: 2.10 cm TAPSE (M-mode): 2.4 cm LEFT ATRIUM             Index      RIGHT ATRIUM          Index LA diam:        3.00 cm 1.53 cm/m RA Area:     9.91 cm LA Vol (A2C):   18.0 ml 9.19 ml/m RA Volume:   19.30 ml 9.85 ml/m LA Vol (A4C):   16.0 ml 8.17 ml/m LA Biplane Vol: 18.1 ml 9.24 ml/m  AORTIC VALVE LVOT Vmax:   177.00 cm/s LVOT Vmean:  133.000 cm/s LVOT VTI:    0.348 m  AORTA Ao Root diam: 2.80 cm MITRAL VALVE MV Area (PHT): 4.49 cm    SHUNTS MV Decel Time: 169 msec    Systemic VTI:  0.35 m MV E velocity: 73.07 cm/s  Systemic Diam: 2.10 cm MV A velocity: 81.50 cm/s MV E/A ratio:  0.90 Mihai Croitoru MD Electronically signed by Sanda Klein MD Signature Date/Time: 04/28/2020/10:16:03 AM    Final    US Abdomen Limited RUQ (LIVER/GB)  Result Date: 04/28/2020 CLINICAL DATA:  Bilirubinemia, sepsis EXAM: ULTRASOUND ABDOMEN LIMITED RIGHT UPPER QUADRANT COMPARISON:  06/26/2019 FINDINGS: Gallbladder: Sludge layering within the gallbladder. No visible stones or wall thickening. Common bile duct: Diameter: Normal caliber, 2 mm Liver: Coarsened echotexture and nodular contours compatible with cirrhosis. No focal hepatic abnormality. Portal vein difficult to visualize, with apparent reversal of flow. Other: Large cyst in the right kidney measures 9 cm. IMPRESSION: Changes of cirrhosis and portal venous hypertension. Portal vein is difficult to visualize with probable reversal of flow in the portal vein versus thrombosis. Sludge within the gallbladder. No cholelithiasis or acute cholecystitis. Electronically Signed   By: Rolm Baptise M.D.   On: 04/28/2020 01:17      IMPRESSION:    *  Rhabdomyolysis after being found down, unresponsive, hypotensive hypothermic. Intubated on vent, on pressors.  *   Bright red blood per rectum.  Suspect ischemic colitis.  *    shock liver. Background cirrhosis, chronic hep B.  *   Macrocytic anemia.  Hb 16.8 >> 8.7.  Initially quite volume contracted  *    thrombocytopenia.  Platelets 77K.  *    coagulopathy.  INR 2.5.  Vitamin K ordered, received FFP times  *    Tox screen THC positive, EtOH less than 10.    PLAN:     *    supportive care.  *    ?  EGD so we can decide whether he still needs PPI and octreotide drip?   Azucena Freed  04/28/2020, 2:43 PM Phone [phone]   Attending physician's note   I have taken a history, examined the patient and reviewed the chart. I agree with the Advanced Practitioner's note, impression and recommendations.  65 year old male with history of seizures status postcraniotomy, chronic hep B cirrhosis with low viral load, substance abuse was found down, has rhabdomyolysis Intubated, sedated on pressors  He is having hematochezia Hemoglobin trended down 11--->8 Coagulopathic, INR 2.5  Elevated transaminases secondary to rhabdo and possible shock liver  Meld Na score 33  Continue octreotide gtt. and PPI Ceftriaxone for SBP prophylaxis  Unclear etiology for hematochezia, differential includes ischemic colitis but cannot exclude variceal hemorrhage or gastroduodenal ulcer  Will need EGD to exclude variceal hemorrhage or peptic ulcer disease  Monitor hemoglobin and transfuse if below 7 Continue supportive care N.p.o. after midnight  Plan for bedside EGD tomorrow, discussed plan with his sister. The risks and benefits as well as alternatives of endoscopic procedure(s) have been discussed and reviewed the sister.Marland Kitchen  She was provided an opportunity to ask questions and all were answered.  She agreed with the plan and demonstrated an understanding   Patient was seen by [facility]  as outpatient, they will plan to take over his care.     Damaris Hippo , MD [phone]

## 2020-04-28 NOTE — Progress Notes (Signed)
Pt transported to CT and back to 2M13 without any complications.

## 2020-04-28 NOTE — Procedures (Signed)
Central Venous Catheter Insertion Procedure Note  Terry Boyle  [id number]  September 03, 1955  Date:04/28/20  Time:8:53 AM   Provider Performing:Gwenivere Hiraldo R Keria Widrig   Procedure: Insertion of Non-tunneled Central Venous Catheter(36556) with US guidance (36644)   Indication(s) Medication administration, blood products  Consent Unable to obtain consent due to emergent nature of procedure.  Anesthesia Topical only with 1% lidocaine   Timeout Verified patient identification, verified procedure, site/side was marked, verified correct patient position, special equipment/implants available, medications/allergies/relevant history reviewed, required imaging and test results available.  Sterile Technique Maximal sterile technique including full sterile barrier drape, hand hygiene, sterile gown, sterile gloves, mask, hair covering, sterile ultrasound probe cover (if used).  Procedure Description Area of catheter insertion was cleaned with chlorhexidine and draped in sterile fashion.  With real-time ultrasound guidance a introducer sheath was placed into the left internal jugular vein. Nonpulsatile blood flow was noted. Manometry was used with a static column of blood noted. The guidewire was visualized coursing through the vein and not visualized near or in the carotid. Easy flushing noted in all ports.  The catheter was sutured in place and sterile dressing applied.  Complications/Tolerance None; patient tolerated the procedure well. Chest X-ray is ordered to verify placement.    EBL Minimal  Specimen(s) None

## 2020-04-28 NOTE — Progress Notes (Addendum)
[facility] Progress Note Patient Name: Terry Boyle DOB: 1955/03/09 MRN: [id number]   Date of Service  04/28/2020  HPI/Events of Note  Notified of maximum on norepinephrine allowable for peripheral line.  eICU Interventions  Added vasopressin Ordered 500 cc NS bolus Get ABG Bicarb push as appears to have acidotic pattern of breathing with bicarb earlier at 12. Repeat labs pending Bedside CCM team informed that patient will likely need central line if remains hypotensive     Intervention Category Major Interventions: Hypotension - evaluation and management  Judd Lien 04/28/2020, 6:16 AM

## 2020-04-28 NOTE — Progress Notes (Signed)
NAME:  Terry Boyle, MRN:  [id number], DOB:  03-12-1955, LOS: 1 ADMISSION DATE:  04/16/2020, CONSULTATION DATE:  04/26/2020 REFERRING MD:  Forestine Na , CHIEF COMPLAINT:  Encephalopathy, Shock   History of Present Illness:  Terry Boyle was found down by neighbor who checks on him daily, last seen normal 04/26/20 PM.  Initially described as completely unresponsive, was hypothermic (initial temp 87.6). At 4pm hour described as making spontaneous movements, turning head, still without eye contact or blink to threat. Intubated at about 730pm for altered mental status. Episode of bloody stool after intubation at ED. Hypotension, started on levophed at around 7pm prior to intubation. Moderate volume BRBPR here on arrival in ICU. In ED received NS 3.5 L bolus total, zosyn/vanc, levophed.   CT head negative for acute.   Pertinent  Medical History  HTN, Chronic Hep B, Seizure, H/O Left Crani, Cirrhosis, H/O EGD with noted hepatic gastropathy and gastric ulcers.   Significant Hospital Events: Including procedures, antibiotic start and stop dates in addition to other pertinent events   . 4/18: Intubated. Shock. On levophed/vasopressin. Given 2 units FFP for INR 2.1   Interim History / Subjective:  As above.   Objective   Blood pressure 92/63, pulse 86, temperature (!) 96.62 F (35.9 C), resp. rate (!) 42, height 6\' 1"  (1.854 m), weight 72.8 kg, SpO2 99 %.    Vent Mode: PRVC FiO2 (%):  [40 %-100 %] 40 % Set Rate:  [16 bmp-32 bmp] 32 bmp Vt Set:  [630 mL] 630 mL PEEP:  [5 cmH20] 5 cmH20 Plateau Pressure:  [9 cmH20-16 cmH20] 16 cmH20   Intake/Output Summary (Last 24 hours) at 04/28/2020 1133 Last data filed at 04/28/2020 1101 Gross per 24 hour  Intake 7835.59 ml  Output 40 ml  Net 7795.59 ml   Filed Weights   04/16/2020 1112 04/28/20 0500  Weight: 73 kg 72.8 kg    Examination: General: Critically ill adult male on vent  HENT: ETT/OG in place  Lungs: Diminished to bases, vent assisted  breaths Cardiovascular: RRR, no MRG Abdomen: thin, soft, active bowel sounds Extremities: -edema Neuro: sedated, pupils 2 mm and sluggish   GU: foley in place   Labs/imaging that I havepersonally reviewed  (right click and "Reselect all SmartList Selections" daily)  Labs and Imaging   Resolved Hospital Problem list     Assessment & Plan:   Shock, multifactorial, no clear infectious process, GI bleed, Rhabdo  Plan -Titrate vasopressors for MAP goal >65 (currently on 9 mcg/min levophed and 0.04 vasopressin)  -Awaiting ECHO -D/C Vancomycin, MRSA PCR negative -Continue Zosyn  -Obtain CT C/A/P   Encephalopathy, multifactorial with uremia, shock, and liver failure   -CT Head Negative for acute 4/18 H/O Seizures  Plan -Obtain EEG -Renal Dose home Keppra -Remains management as below  Acute GI Bleed Liver Cirrhosis > now with acute dysfunction  Chronic Hep B H/O EGD 2020 with portal hypertension gastropathy and gastric ulcers, no varices noted H/O Pancreatic Insufficieny   Plan -Consult GI  -Continue NPO -Continue PPI and Octreotide   Coagulopathy with shock state and liver dysfunction  -S/P 2 units FFP Plan -Obtain DIC panel  -Trend CBC -Maintain Hemoglobin >7 and PLT >50   Acute Renal Injury secondary to hypoperfusion with severe dehydration and shock state Mild Rhabdo  Lactic Acidosis  Anion Gap Metabolic Acidosis  Plan -Trend BMP, CK, Lactic Acid  -Continue to hydrate  -Will need Nephrology Consult if no improvement   HIV >> Will remove  this as a diagnosis  -Send By ID on 03/2019 noted that there is no information that validates patient has HIV. HIV antibody 03/2019 negative.    Best practice (right click and "Reselect all SmartList Selections" daily)  Diet:  NPO Pain/Anxiety/Delirium protocol (if indicated): Yes (RASS goal -1) VAP protocol (if indicated): Yes DVT prophylaxis: SCD GI prophylaxis: PPI Glucose control:  SSI Yes Central venous access:  Yes,  and it is still needed Arterial line:  N/A Foley:  Yes, and it is still needed Mobility:  bed rest  PT consulted: N/A Last date of multidisciplinary goals of care discussion [4/19] Code Status:  limited Disposition: Sister updated on current condition and plan. States patient would not want to have extensive measures done. Discussed code status. Agree to DNR with no CPR or ACLS at this time. Wish to continue current medical care.   Labs   CBC: Recent Labs  Lab 05/01/2020 1136 04/26/2020 1951 04/10/2020 2348 04/28/20 0012  WBC 10.2 12.2* 13.4*  --   NEUTROABS 9.0* 10.2* 11.3*  --   HGB 16.8 12.2* 9.7* 11.2*  HCT 46.0 33.3* 27.3* 33.0*  MCV 101.5* 102.1* 104.2*  --   PLT 134* 99* 106*  --     Basic Metabolic Panel: Recent Labs  Lab 05/01/2020 1136 05/02/2020 2348 04/28/20 0012  NA 136 142 140  K 3.9 5.1 4.9  CL 99 115*  --   CO2 13* 12*  --   GLUCOSE 141* 145*  --   BUN 146* 139*  --   CREATININE 7.05* 6.12*  --   CALCIUM 9.1 6.9*  --   MG  --  3.0*  --    GFR: Estimated Creatinine Clearance: 12.9 mL/min (A) (by C-G formula based on SCr of 6.12 mg/dL (H)). Recent Labs  Lab 04/18/2020 1136 05/05/2020 1241 04/15/2020 1951 04/19/2020 2348 04/28/20 0108  PROCALCITON  --   --   --  2.06 2.22  WBC 10.2  --  12.2* 13.4*  --   LATICACIDVEN 6.4* 6.7*  --  5.2*  --     Liver Function Tests: Recent Labs  Lab 05/09/2020 1136 05/03/2020 2348  AST 167* 648*  ALT 107* 454*  ALKPHOS 118 74  BILITOT 3.0* 1.8*  PROT 8.8* 5.3*  ALBUMIN 4.4 2.6*   Recent Labs  Lab 04/21/2020 2348  LIPASE 133*   Recent Labs  Lab 04/12/2020 2348  AMMONIA 664*    ABG    Component Value Date/Time   PHART 7.342 (L) 04/28/2020 0012   PCO2ART 23.1 (L) 04/28/2020 0012   PO2ART 256 (H) 04/28/2020 0012   HCO3 12.8 (L) 04/28/2020 0012   TCO2 14 (L) 04/28/2020 0012   ACIDBASEDEF 12.0 (H) 04/28/2020 0012   O2SAT 100.0 04/28/2020 0012     Coagulation Profile: Recent Labs  Lab 04/26/2020 2348  INR 2.1*     Cardiac Enzymes: Recent Labs  Lab 04/11/2020 1514 04/26/2020 2348  CKTOTAL 3,270* 2,417*    HbA1C: Hgb A1c MFr Bld  Date/Time Value Ref Range Status  04/28/2020 04:05 AM 5.5 4.8 - 5.6 % Final    Comment:    (NOTE) Pre diabetes:          5.7%-6.4%  Diabetes:              >6.4%  Glycemic control for   <7.0% adults with diabetes     CBG: Recent Labs  Lab 04/23/2020 2156 04/28/20 0324 04/28/20 0722  GLUCAP 157* 125* 101*    Review  of Systems:   Unable to review as patient is intubated/sedated.   Past Medical History:  He,  has a past medical history of HIV (human immunodeficiency virus infection) ([place]) and Seizure disorder ([place]).   Surgical History:   Past Surgical History:  Procedure Laterality Date  . BIOPSY  06/24/2018   Procedure: BIOPSY;  Surgeon: Ronnette Juniper, MD;  Location: [facility];  Service: Gastroenterology;;  . CRANIECTOMY Left   . ESOPHAGOGASTRODUODENOSCOPY (EGD) WITH PROPOFOL N/A 06/24/2018   Procedure: ESOPHAGOGASTRODUODENOSCOPY (EGD) WITH PROPOFOL;  Surgeon: Ronnette Juniper, MD;  Location: [facility];  Service: Gastroenterology;  Laterality: N/A;     Social History:   reports that he has been smoking cigarettes. He has been smoking about 1.00 pack per day. He has never used smokeless tobacco. He reports previous alcohol use. He reports previous drug use.   Family History:  His Family history is unknown by patient.   Allergies No Known Allergies   Home Medications  Prior to Admission medications   Medication Sig Start Date End Date Taking? Authorizing Provider  albuterol (PROAIR HFA) 108 (90 Base) MCG/ACT inhaler Inhale into the lungs. 05/07/19  Yes [provider]  ibuprofen (ADVIL) 800 MG tablet Take 1 tablet by mouth every 8 (eight) hours as needed. 08/14/19  Yes [provider]  acetaminophen-codeine (TYLENOL #3) 300-30 MG tablet Take 1 tablet by mouth every 8 (eight) hours as needed for moderate pain. 03/20/19   Fulp, Cammie,  MD  albuterol (VENTOLIN HFA) 108 (90 Base) MCG/ACT inhaler 2 puffs every 4-6 hours as needed 07/09/18   Fulp, Cammie, MD  amLODipine (NORVASC) 10 MG tablet Take 1 tablet (10 mg total) by mouth daily. 02/17/20   Ladell Pier, MD  folic acid (FOLVITE) 1 MG tablet Take 1 mg by mouth daily. 11/19/19   [provider]  hyoscyamine (LEVSIN) 0.125 MG tablet TAKE 1 TABLET BY MOUTH EVERY 6 HOURS AS NEEDED FOR CRAMPS 02/17/20   Ladell Pier, MD  ibuprofen (ADVIL) 600 MG tablet Take 1 tablet (600 mg total) by mouth every 8 (eight) hours as needed. 02/17/20   Ladell Pier, MD  levETIRAcetam (KEPPRA) 1000 MG tablet Take 1 tablet (1,000 mg total) by mouth 2 (two) times daily. Please make PCP appointment. Last fill! 02/17/20   Ladell Pier, MD  lipase/protease/amylase (CREON) 12000-38000 units CPEP capsule Take 1 capsule (12,000 Units total) by mouth 3 (three) times daily with meals. 02/17/20   Ladell Pier, MD  ondansetron (ZOFRAN-ODT) 4 MG disintegrating tablet DISSOLVE 1 TABLET IN MOUTH EVERY 8 HOURS AS NEEDED FOR NAUSEA FOR VOMITING 02/17/20   Ladell Pier, MD  pantoprazole (PROTONIX) 40 MG tablet Take 1 tablet (40 mg total) by mouth daily. 02/17/20   Ladell Pier, MD  polyethylene glycol powder (GLYCOLAX/MIRALAX) 17 GM/SCOOP powder Add 1 scoop (17 gm) of powder to 8 or more ounces of liquid once daily to treat constipation 02/17/20   Ladell Pier, MD  potassium chloride (KLOR-CON) 10 MEQ tablet Take 1 tablet (10 mEq total) by mouth daily. 02/17/20   Ladell Pier, MD  tenofovir (VIREAD) 300 MG tablet Take 1 tablet (300 mg total) by mouth daily. 07/09/18   Fulp, Cammie, MD  tiZANidine (ZANAFLEX) 4 MG tablet TAKE 1 TABLET BY MOUTH AT BEDTIME AS NEEDED FOR MUSCLE SPASM 04/22/19   Antony Blackbird, MD     Critical care time: 83    CRITICAL CARE Performed by: Omar Person   Total critical  care time: 42 minutes  Critical care time was exclusive of separately  billable procedures and treating other patients.  Critical care was necessary to treat or prevent imminent or life-threatening deterioration.  Critical care was time spent personally by me on the following activities: development of treatment plan with patient and/or surrogate as well as nursing, discussions with consultants, evaluation of patient's response to treatment, examination of patient, obtaining history from patient or surrogate, ordering and performing treatments and interventions, ordering and review of laboratory studies, ordering and review of radiographic studies, pulse oximetry and re-evaluation of patient's condition.

## 2020-04-28 NOTE — Progress Notes (Signed)
Patient went to CT and while down there the fentanyl bag started leaking after patient was moved onto table and back. Heide Guile witnessed the leak with me and watched as I re-primed the bag with new tubing. Will continue to monitor. Bartholomew Crews, RN 04/28/2020 4:37 PM

## 2020-04-28 NOTE — Progress Notes (Signed)
eLink Physician-Brief Progress Note Patient Name: [place] Souter DOB: 1955-09-16 MRN: [id number]   Date of Service  04/28/2020  HPI/Events of Note  Recieved request for CBG monitoring  eICU Interventions  CBG monitoring ordered Low dose SSI ordered q 4 as well     Intervention Category Major Interventions: Hyperglycemia - active titration of insulin therapy  Judd Lien 04/28/2020, 4:03 AM

## 2020-04-28 NOTE — Progress Notes (Signed)
EEG completed; results pending.    

## 2020-04-29 ENCOUNTER — Encounter (HOSPITAL_COMMUNITY): Payer: Self-pay | Admitting: Pulmonary Disease

## 2020-04-29 ENCOUNTER — Encounter (HOSPITAL_COMMUNITY): Payer: Self-pay | Admitting: Certified Registered Nurse Anesthetist

## 2020-04-29 ENCOUNTER — Encounter (HOSPITAL_COMMUNITY): Admission: EM | Disposition: E | Payer: Self-pay | Source: Home / Self Care | Attending: Pulmonary Disease

## 2020-04-29 ENCOUNTER — Inpatient Hospital Stay (HOSPITAL_COMMUNITY): Payer: Medicaid Other

## 2020-04-29 DIAGNOSIS — R571 Hypovolemic shock: Secondary | ICD-10-CM

## 2020-04-29 DIAGNOSIS — K7469 Other cirrhosis of liver: Secondary | ICD-10-CM | POA: Diagnosis not present

## 2020-04-29 DIAGNOSIS — N170 Acute kidney failure with tubular necrosis: Secondary | ICD-10-CM

## 2020-04-29 DIAGNOSIS — R578 Other shock: Secondary | ICD-10-CM

## 2020-04-29 HISTORY — PX: ESOPHAGOGASTRODUODENOSCOPY: SHX5428

## 2020-04-29 LAB — PREPARE FRESH FROZEN PLASMA

## 2020-04-29 LAB — CALCIUM, IONIZED: Calcium, Ionized, Serum: 3.5 mg/dL — ABNORMAL LOW (ref 4.5–5.6)

## 2020-04-29 LAB — BPAM FFP
Blood Product Expiration Date: 202204242359
Blood Product Expiration Date: 202204242359
ISSUE DATE / TIME: 202204190504
ISSUE DATE / TIME: 202204190620
Unit Type and Rh: 5100
Unit Type and Rh: 5100

## 2020-04-29 LAB — POCT I-STAT 7, (LYTES, BLD GAS, ICA,H+H)
Acid-base deficit: 4 mmol/L — ABNORMAL HIGH (ref 0.0–2.0)
Bicarbonate: 16.9 mmol/L — ABNORMAL LOW (ref 20.0–28.0)
Calcium, Ion: 1.03 mmol/L — ABNORMAL LOW (ref 1.15–1.40)
HCT: 24 % — ABNORMAL LOW (ref 39.0–52.0)
Hemoglobin: 8.2 g/dL — ABNORMAL LOW (ref 13.0–17.0)
O2 Saturation: 99 %
Patient temperature: 99
Potassium: 3.1 mmol/L — ABNORMAL LOW (ref 3.5–5.1)
Sodium: 147 mmol/L — ABNORMAL HIGH (ref 135–145)
TCO2: 17 mmol/L — ABNORMAL LOW (ref 22–32)
pCO2 arterial: 19.3 mmHg — CL (ref 32.0–48.0)
pH, Arterial: 7.549 — ABNORMAL HIGH (ref 7.350–7.450)
pO2, Arterial: 108 mmHg (ref 83.0–108.0)

## 2020-04-29 LAB — CBC
HCT: 24.8 % — ABNORMAL LOW (ref 39.0–52.0)
Hemoglobin: 8.8 g/dL — ABNORMAL LOW (ref 13.0–17.0)
MCH: 37.3 pg — ABNORMAL HIGH (ref 26.0–34.0)
MCHC: 35.5 g/dL (ref 30.0–36.0)
MCV: 105.1 fL — ABNORMAL HIGH (ref 80.0–100.0)
Platelets: 65 10*3/uL — ABNORMAL LOW (ref 150–400)
RBC: 2.36 MIL/uL — ABNORMAL LOW (ref 4.22–5.81)
RDW: 13.7 % (ref 11.5–15.5)
WBC: 10.5 10*3/uL (ref 4.0–10.5)
nRBC: 0.9 % — ABNORMAL HIGH (ref 0.0–0.2)

## 2020-04-29 LAB — PHOSPHORUS: Phosphorus: 4.1 mg/dL (ref 2.5–4.6)

## 2020-04-29 LAB — PROTIME-INR
INR: 2.4 — ABNORMAL HIGH (ref 0.8–1.2)
Prothrombin Time: 26.4 seconds — ABNORMAL HIGH (ref 11.4–15.2)

## 2020-04-29 LAB — BPAM PLATELET PHERESIS
Blood Product Expiration Date: 202204212359
Unit Type and Rh: 5100

## 2020-04-29 LAB — URINE CULTURE: Culture: NO GROWTH

## 2020-04-29 LAB — PREPARE PLATELET PHERESIS: Unit division: 0

## 2020-04-29 LAB — COMPREHENSIVE METABOLIC PANEL
ALT: 1176 U/L — ABNORMAL HIGH (ref 0–44)
AST: 1436 U/L — ABNORMAL HIGH (ref 15–41)
Albumin: 3 g/dL — ABNORMAL LOW (ref 3.5–5.0)
Alkaline Phosphatase: 79 U/L (ref 38–126)
Anion gap: 11 (ref 5–15)
BUN: 116 mg/dL — ABNORMAL HIGH (ref 8–23)
CO2: 18 mmol/L — ABNORMAL LOW (ref 22–32)
Calcium: 7.4 mg/dL — ABNORMAL LOW (ref 8.9–10.3)
Chloride: 116 mmol/L — ABNORMAL HIGH (ref 98–111)
Creatinine, Ser: 4.19 mg/dL — ABNORMAL HIGH (ref 0.61–1.24)
GFR, Estimated: 15 mL/min — ABNORMAL LOW (ref >60)
Glucose, Bld: 145 mg/dL — ABNORMAL HIGH (ref 70–99)
Potassium: 3.4 mmol/L — ABNORMAL LOW (ref 3.5–5.1)
Sodium: 145 mmol/L (ref 135–145)
Total Bilirubin: 1.9 mg/dL — ABNORMAL HIGH (ref 0.3–1.2)
Total Protein: 5.2 g/dL — ABNORMAL LOW (ref 6.5–8.1)

## 2020-04-29 LAB — GLUCOSE, CAPILLARY
Glucose-Capillary: 135 mg/dL — ABNORMAL HIGH (ref 70–99)
Glucose-Capillary: 140 mg/dL — ABNORMAL HIGH (ref 70–99)
Glucose-Capillary: 158 mg/dL — ABNORMAL HIGH (ref 70–99)
Glucose-Capillary: 164 mg/dL — ABNORMAL HIGH (ref 70–99)
Glucose-Capillary: 149 mg/dL — ABNORMAL HIGH (ref 70–99)
Glucose-Capillary: 127 mg/dL — ABNORMAL HIGH (ref 70–99)

## 2020-04-29 LAB — TRIGLYCERIDES: Triglycerides: 137 mg/dL (ref <150)

## 2020-04-29 LAB — PROCALCITONIN: Procalcitonin: 2.38 ng/mL

## 2020-04-29 LAB — MAGNESIUM: Magnesium: 2.8 mg/dL — ABNORMAL HIGH (ref 1.7–2.4)

## 2020-04-29 SURGERY — EGD (ESOPHAGOGASTRODUODENOSCOPY)
Anesthesia: Monitor Anesthesia Care

## 2020-04-29 MED ORDER — POTASSIUM CHLORIDE 20 MEQ PO PACK
20.0000 meq | PACK | Freq: Once | ORAL | Status: AC
  Administered 2020-04-29: 20 meq
  Filled 2020-04-29: qty 1

## 2020-04-29 MED ORDER — FENTANYL CITRATE (PF) 100 MCG/2ML IJ SOLN
INTRAMUSCULAR | Status: AC
  Filled 2020-04-29: qty 2

## 2020-04-29 MED ORDER — LEVETIRACETAM 100 MG/ML PO SOLN
500.0000 mg | Freq: Two times a day (BID) | ORAL | Status: DC
  Administered 2020-04-29 – 2020-04-30 (×3): 500 mg
  Filled 2020-04-29 (×3): qty 5

## 2020-04-29 MED ORDER — SODIUM CHLORIDE 0.9 % IV SOLN: INTRAVENOUS | Status: DC

## 2020-04-29 MED ORDER — MIDAZOLAM HCL (PF) 5 MG/ML IJ SOLN
INTRAMUSCULAR | Status: AC
  Filled 2020-04-29: qty 1

## 2020-04-29 MED ORDER — VITAMIN K1 10 MG/ML IJ SOLN
10.0000 mg | Freq: Once | INTRAVENOUS | Status: AC
  Administered 2020-04-29: 10 mg via INTRAVENOUS
  Filled 2020-04-29: qty 1

## 2020-04-29 NOTE — Op Note (Signed)
[facility] Patient Name: Terry Boyle Procedure Date : 04/28/2020 MRN: [id number] Attending MD: Clarene Essex , MD Date of Birth: 21-May-1955 CSN: [id number] Age: 65 Admit Type: Inpatient Procedure:                Upper GI endoscopy Indications:              Recent gastrointestinal bleeding in patient with                            cirrhosis Providers:                Clarene Essex, MD, Jeanella Cara, RN, Dulcy Fanny, Lesia Sago, Technician Referring MD:              Medicines:                None Complications:            No immediate complications. Estimated Blood Loss:     Estimated blood loss: none. Procedure:                Pre-Anesthesia Assessment:                           - Prior to the procedure, a History and Physical                            was performed, and patient medications and                            allergies were reviewed. The patient's tolerance of                            previous anesthesia was also reviewed. The risks                            and benefits of the procedure and the sedation                            options and risks were discussed with the patient.                            All questions were answered, and informed consent                            was obtained. Prior Anticoagulants: The patient has                            taken no previous anticoagulant or antiplatelet                            agents. ASA Grade Assessment: III - A patient with                            severe systemic  disease. After reviewing the risks                            and benefits, the patient was deemed in                            satisfactory condition to undergo the procedure.                           After obtaining informed consent, the endoscope was                            passed under direct vision. Throughout the                            procedure, the patient's blood pressure,  pulse, and                            oxygen saturations were monitored continuously. The                            GIF-H190 ([id number]) Olympus gastroscope was                            introduced through the mouth, and advanced to the                            second part of duodenum. The upper GI endoscopy was                            accomplished without difficulty. The patient                            tolerated the procedure well. Scope In: Scope Out: Findings:      A Tiny hiatal hernia was present.      Grade I varices were found in the lower third of the esophagus. They       were diminutive in size and for the most part flattened out with air       insufflation.      Very mild if any portal hypertensive gastropathy was found in the       gastric body and proximal stomach.      The duodenal bulb, first portion of the duodenum and second portion of       the duodenum were normal.      The exam was otherwise without abnormality without any signs of bleeding       seen. Impression:               - Tiny hiatal hernia.                           - Grade I esophageal varices.                           - Portal hypertensive gastropathy.                           -  Normal duodenal bulb, first portion of the                            duodenum and second portion of the duodenum.                           - The examination was otherwise normal.                           - No specimens collected. Recommendation:           - NPO indefinitely can advance diet when medically                            stable as per ICU team. Could wean his octreotide                            and either use once a day pump inhibitor or H2                            blocker if you prefer                           - Continue present medications. Care per ICU team                            patient probably has ischemic colitis to explain                            his bright red blood and CT findings  which goes                            along with his shock liver and acute kidney failure                           - Return to GI clinic PRN.                           - Telephone GI clinic if symptomatic PRN. Procedure Code(s):        --- Professional ---                           [phone], Esophagogastroduodenoscopy, flexible,                            transoral; diagnostic, including collection of                            specimen(s) by brushing or washing, when performed                            (separate procedure) Diagnosis Code(s):        --- Professional ---  K44.9, Diaphragmatic hernia without obstruction or                            gangrene                           I85.00, Esophageal varices without bleeding                           K76.6, Portal hypertension                           K31.89, Other diseases of stomach and duodenum                           K92.2, Gastrointestinal hemorrhage, unspecified CPT copyright 2019 [employer]. All rights reserved. The codes documented in this report are preliminary and upon coder review may  be revised to meet current compliance requirements. Clarene Essex, MD 04/23/2020 12:26:08 PM This report has been signed electronically. Number of Addenda: 0

## 2020-04-29 NOTE — ED Provider Notes (Addendum)
[name] Provider Note   CSN: [id number] Arrival date & time: 04/16/2020  1055     History Chief Complaint  Patient presents with  . Unresponsive    Terry Boyle is a 65 y.o. male.  Patient brought in by EMS confused and obtunded found on the ground.  Unable to obtain history from the patient himself no family present.  Per EMS patient lives alone.  He has a neighbor that checks up on the patient very frequently.  The neighbor called 911 because he had last seen the patient 24 hours ago at his baseline normal well.  He had not seen him all day the following day and checked upon the patient and found him on the ground.  EMS was called.  Patient's records indicate a history of HIV and seizure disorder.        Past Medical History:  Diagnosis Date  . HIV (human immunodeficiency virus infection) ([place])   . Seizure disorder [facility])     Patient Active Problem List   Diagnosis Date Noted  . Sepsis ([place]) 04/14/2020  . History of GI bleed 02/17/2020  . Essential hypertension 08/15/2019  . Pancreatic disease 06/27/2019  . Acute pancreatitis 06/26/2019  . Other cirrhosis of liver ([place]) 06/26/2019  . Polysubstance (excluding opioids) dependence ([place]) 06/26/2019  . Acute blood loss anemia 06/22/2018  . 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 Marijuana use 09/01/2017  . Bilateral inguinal hernia without obstruction or gangrene 09/01/2017  . Chronic hepatitis B virus infection ([place]) 07/26/2017  . Right elbow pain 07/26/2017  . Pancytopenia ([place]) 01/12/2017  . Seizures ([place]) 01/09/2017  . Personal history of tobacco use 01/09/2017  . History of traumatic brain injury 01/11/1980    Past Surgical History:  Procedure Laterality Date  . BIOPSY  06/24/2018   Procedure: BIOPSY;  Surgeon: Ronnette Juniper, MD;  Location: [facility];  Service: Gastroenterology;;  . CRANIECTOMY Left   . ESOPHAGOGASTRODUODENOSCOPY (EGD) WITH PROPOFOL N/A 06/24/2018   Procedure:  ESOPHAGOGASTRODUODENOSCOPY (EGD) WITH PROPOFOL;  Surgeon: Ronnette Juniper, MD;  Location: [facility];  Service: Gastroenterology;  Laterality: N/A;       Family History  Family history unknown: Yes    Social History   Tobacco Use  . Smoking status: Current Every Day Smoker    Packs/day: 1.00    Types: Cigarettes  . Smokeless tobacco: Never Used  Vaping Use  . Vaping Use: Never used  Substance Use Topics  . Alcohol use: Not Currently  . Drug use: Not Currently    Home Medications Prior to Admission medications   Medication Sig Start Date End Date Taking? Authorizing Provider  albuterol (PROAIR HFA) 108 (90 Base) MCG/ACT inhaler Inhale into the lungs. 05/07/19  Yes [provider]  ibuprofen (ADVIL) 800 MG tablet Take 1 tablet by mouth every 8 (eight) hours as needed. 08/14/19  Yes [provider]  acetaminophen-codeine (TYLENOL #3) 300-30 MG tablet Take 1 tablet by mouth every 8 (eight) hours as needed for moderate pain. 03/20/19   Fulp, Cammie, MD  albuterol (VENTOLIN HFA) 108 (90 Base) MCG/ACT inhaler 2 puffs every 4-6 hours as needed 07/09/18   Fulp, Cammie, MD  amLODipine (NORVASC) 10 MG tablet Take 1 tablet (10 mg total) by mouth daily. 02/17/20   Ladell Pier, MD  folic acid (FOLVITE) 1 MG tablet Take 1 mg by mouth daily. 11/19/19   [provider]  hyoscyamine (LEVSIN) 0.125 MG tablet TAKE 1 TABLET BY MOUTH EVERY 6 HOURS AS  NEEDED FOR CRAMPS 02/17/20   Ladell Pier, MD  ibuprofen (ADVIL) 600 MG tablet Take 1 tablet (600 mg total) by mouth every 8 (eight) hours as needed. 02/17/20   Ladell Pier, MD  levETIRAcetam (KEPPRA) 1000 MG tablet Take 1 tablet (1,000 mg total) by mouth 2 (two) times daily. Please make PCP appointment. Last fill! 02/17/20   Ladell Pier, MD  lipase/protease/amylase (CREON) 12000-38000 units CPEP capsule Take 1 capsule (12,000 Units total) by mouth 3 (three) times daily with meals. 02/17/20   Ladell Pier, MD   ondansetron (ZOFRAN-ODT) 4 MG disintegrating tablet DISSOLVE 1 TABLET IN MOUTH EVERY 8 HOURS AS NEEDED FOR NAUSEA FOR VOMITING 02/17/20   Ladell Pier, MD  pantoprazole (PROTONIX) 40 MG tablet Take 1 tablet (40 mg total) by mouth daily. 02/17/20   Ladell Pier, MD  polyethylene glycol powder (GLYCOLAX/MIRALAX) 17 GM/SCOOP powder Add 1 scoop (17 gm) of powder to 8 or more ounces of liquid once daily to treat constipation 02/17/20   Ladell Pier, MD  potassium chloride (KLOR-CON) 10 MEQ tablet Take 1 tablet (10 mEq total) by mouth daily. 02/17/20   Ladell Pier, MD  tenofovir (VIREAD) 300 MG tablet Take 1 tablet (300 mg total) by mouth daily. 07/09/18   Fulp, Cammie, MD  tiZANidine (ZANAFLEX) 4 MG tablet TAKE 1 TABLET BY MOUTH AT BEDTIME AS NEEDED FOR MUSCLE SPASM 04/22/19   Antony Blackbird, MD    Allergies    Patient has no known allergies.  Review of Systems   Review of Systems  Unable to perform ROS: Patient unresponsive    Physical Exam Updated Vital Signs BP (!) 108/58   Pulse 75   Temp 98.24 F (36.8 C)   Resp 13   Ht 6\' 1"  (1.854 m)   Wt 73 kg   SpO2 100%   BMI 21.23 kg/m   Physical Exam Constitutional:      Appearance: He is well-developed.     Comments: Obtunded, minimally reactive to painful stimuli.  HENT:     Head: Normocephalic.     Nose: Nose normal.  Eyes:     Comments: Pupils 4-3 and sluggish laterally.  Cardiovascular:     Rate and Rhythm: Normal rate.  Pulmonary:     Effort: Pulmonary effort is normal.  Skin:    Coloration: Skin is not jaundiced.  Neurological:     Comments: Patient is significantly obtunded, unable to obtain any verbal communication.  Not following any commands.  No purposeful movement noted.  Maintaining his airway with regular breaths otherwise.     ED Results / Procedures / Treatments   Labs (all labs ordered are listed, but only abnormal results are displayed) Labs Reviewed  CBC WITH DIFFERENTIAL/PLATELET -  Abnormal; Notable for the following components:      Result Value   MCV 101.5 (*)    MCH 37.1 (*)    MCHC 36.5 (*)    Platelets 134 (*)    Neutro Abs 9.0 (*)    Lymphs Abs 0.2 (*)    All other components within normal limits  COMPREHENSIVE METABOLIC PANEL - Abnormal; Notable for the following components:   CO2 13 (*)    Glucose, Bld 141 (*)    BUN 146 (*)    Creatinine, Ser 7.05 (*)    Total Protein 8.8 (*)    AST 167 (*)    ALT 107 (*)    Total Bilirubin 3.0 (*)    GFR,  Estimated 8 (*)    Anion gap 24 (*)    All other components within normal limits  LACTIC ACID, PLASMA - Abnormal; Notable for the following components:   Lactic Acid, Venous 6.4 (*)    All other components within normal limits  LACTIC ACID, PLASMA - Abnormal; Notable for the following components:   Lactic Acid, Venous 6.7 (*)    All other components within normal limits  BLOOD GAS, ARTERIAL - Abnormal; Notable for the following components:   pCO2 arterial <19.0 (*)    pO2, Arterial 123 (*)    Bicarbonate 15.6 (*)    Acid-base deficit 12.8 (*)    All other components within normal limits  URINALYSIS, ROUTINE W REFLEX MICROSCOPIC - Abnormal; Notable for the following components:   Hgb urine dipstick MODERATE (*)    All other components within normal limits  RAPID URINE DRUG SCREEN, HOSP PERFORMED - Abnormal; Notable for the following components:   Tetrahydrocannabinol POSITIVE (*)    All other components within normal limits  TSH - Abnormal; Notable for the following components:   TSH 0.276 (*)    All other components within normal limits  CK - Abnormal; Notable for the following components:   Total CK 3,270 (*)    All other components within normal limits  CBC WITH DIFFERENTIAL/PLATELET - Abnormal; Notable for the following components:   WBC 12.2 (*)    RBC 3.26 (*)    Hemoglobin 12.2 (*)    HCT 33.3 (*)    MCV 102.1 (*)    MCH 37.4 (*)    MCHC 36.6 (*)    Platelets 99 (*)    Neutro Abs 10.2 (*)     Lymphs Abs 0.3 (*)    Monocytes Absolute 1.7 (*)    All other components within normal limits  BLOOD GAS, ARTERIAL - Abnormal; Notable for the following components:   pH, Arterial 7.267 (*)    pCO2 arterial 30.7 (*)    pO2, Arterial 546 (*)    Bicarbonate 15.1 (*)    Acid-base deficit 12.0 (*)    All other components within normal limits  GLUCOSE, CAPILLARY - Abnormal; Notable for the following components:   Glucose-Capillary 157 (*)    All other components within normal limits  CBC WITH DIFFERENTIAL/PLATELET - Abnormal; Notable for the following components:   WBC 13.4 (*)    RBC 2.62 (*)    Hemoglobin 9.7 (*)    HCT 27.3 (*)    MCV 104.2 (*)    MCH 37.0 (*)    Platelets 106 (*)    Neutro Abs 11.3 (*)    Lymphs Abs 0.3 (*)    Monocytes Absolute 1.8 (*)    All other components within normal limits  COMPREHENSIVE METABOLIC PANEL - Abnormal; Notable for the following components:   Chloride 115 (*)    CO2 12 (*)    Glucose, Bld 145 (*)    BUN 139 (*)    Creatinine, Ser 6.12 (*)    Calcium 6.9 (*)    Total Protein 5.3 (*)    Albumin 2.6 (*)    AST 648 (*)    ALT 454 (*)    Total Bilirubin 1.8 (*)    GFR, Estimated 10 (*)    All other components within normal limits  MAGNESIUM - Abnormal; Notable for the following components:   Magnesium 3.0 (*)    All other components within normal limits  CK - Abnormal; Notable for the following components:  Total CK 2,417 (*)    All other components within normal limits  LACTIC ACID, PLASMA - Abnormal; Notable for the following components:   Lactic Acid, Venous 5.2 (*)    All other components within normal limits  PROTIME-INR - Abnormal; Notable for the following components:   Prothrombin Time 23.8 (*)    INR 2.1 (*)    All other components within normal limits  AMMONIA - Abnormal; Notable for the following components:   Ammonia 664 (*)    All other components within normal limits  LIPASE, BLOOD - Abnormal; Notable for the  following components:   Lipase 133 (*)    All other components within normal limits  VITAMIN B12 - Abnormal; Notable for the following components:   Vitamin B-12 6,620 (*)    All other components within normal limits  GLUCOSE, CAPILLARY - Abnormal; Notable for the following components:   Glucose-Capillary 125 (*)    All other components within normal limits  CBC - Abnormal; Notable for the following components:   WBC 11.9 (*)    RBC 2.37 (*)    Hemoglobin 8.7 (*)    HCT 24.3 (*)    MCV 102.5 (*)    MCH 36.7 (*)    Platelets 77 (*)    All other components within normal limits  MAGNESIUM - Abnormal; Notable for the following components:   Magnesium 2.9 (*)    All other components within normal limits  PHOSPHORUS - Abnormal; Notable for the following components:   Phosphorus 6.7 (*)    All other components within normal limits  GLUCOSE, CAPILLARY - Abnormal; Notable for the following components:   Glucose-Capillary 101 (*)    All other components within normal limits  DIC (DISSEMINATED INTRAVASCULAR COAGULATION)PANEL - Abnormal; Notable for the following components:   Prothrombin Time 26.7 (*)    INR 2.5 (*)    Fibrinogen 206 (*)    D-Dimer, Quant 5.61 (*)    Platelets 77 (*)    All other components within normal limits  COMPREHENSIVE METABOLIC PANEL - Abnormal; Notable for the following components:   Chloride 115 (*)    CO2 10 (*)    Glucose, Bld 124 (*)    BUN 137 (*)    Creatinine, Ser 6.21 (*)    Calcium 6.7 (*)    Total Protein 5.3 (*)    Albumin 3.1 (*)    AST 1,479 (*)    ALT 911 (*)    Total Bilirubin 2.1 (*)    GFR, Estimated 10 (*)    Anion gap 19 (*)    All other components within normal limits  CK - Abnormal; Notable for the following components:   Total CK 1,955 (*)    All other components within normal limits  GLUCOSE, CAPILLARY - Abnormal; Notable for the following components:   Glucose-Capillary 126 (*)    All other components within normal limits   LACTIC ACID, PLASMA - Abnormal; Notable for the following components:   Lactic Acid, Venous 5.0 (*)    All other components within normal limits  COMPREHENSIVE METABOLIC PANEL - Abnormal; Notable for the following components:   Chloride 115 (*)    CO2 13 (*)    Glucose, Bld 156 (*)    BUN 129 (*)    Creatinine, Ser 5.59 (*)    Calcium 6.5 (*)    Total Protein 4.9 (*)    Albumin 3.0 (*)    AST 1,604 (*)    ALT 1,019 (*)  Total Bilirubin 2.1 (*)    GFR, Estimated 11 (*)    Anion gap 17 (*)    All other components within normal limits  CK - Abnormal; Notable for the following components:   Total CK 1,702 (*)    All other components within normal limits  GLUCOSE, CAPILLARY - Abnormal; Notable for the following components:   Glucose-Capillary 146 (*)    All other components within normal limits  GLUCOSE, CAPILLARY - Abnormal; Notable for the following components:   Glucose-Capillary 233 (*)    All other components within normal limits  GLUCOSE, CAPILLARY - Abnormal; Notable for the following components:   Glucose-Capillary 173 (*)    All other components within normal limits  GLUCOSE, CAPILLARY - Abnormal; Notable for the following components:   Glucose-Capillary 135 (*)    All other components within normal limits  POCT I-STAT 7, (LYTES, BLD GAS, ICA,H+H) - Abnormal; Notable for the following components:   pH, Arterial 7.342 (*)    pCO2 arterial 23.1 (*)    pO2, Arterial 256 (*)    Bicarbonate 12.8 (*)    TCO2 14 (*)    Acid-base deficit 12.0 (*)    Calcium, Ion 0.93 (*)    HCT 33.0 (*)    Hemoglobin 11.2 (*)    All other components within normal limits  POCT I-STAT 7, (LYTES, BLD GAS, ICA,H+H) - Abnormal; Notable for the following components:   pH, Arterial 7.549 (*)    pCO2 arterial 19.3 (*)    Bicarbonate 16.9 (*)    TCO2 17 (*)    Acid-base deficit 4.0 (*)    Sodium 147 (*)    Potassium 3.1 (*)    Calcium, Ion 1.03 (*)    HCT 24.0 (*)    Hemoglobin 8.2 (*)     All other components within normal limits  TROPONIN I (HIGH SENSITIVITY) - Abnormal; Notable for the following components:   Troponin I (High Sensitivity) 53 (*)    All other components within normal limits  TROPONIN I (HIGH SENSITIVITY) - Abnormal; Notable for the following components:   Troponin I (High Sensitivity) 39 (*)    All other components within normal limits  CULTURE, BLOOD (ROUTINE X 2)  CULTURE, BLOOD (ROUTINE X 2)  RESP PANEL BY RT-PCR (FLU A&B, COVID) ARPGX2  MRSA PCR SCREENING  URINE CULTURE  BRAIN NATRIURETIC PEPTIDE  PROCALCITONIN  PROCALCITONIN  T4, FREE  ETHANOL  FOLATE  HEMOGLOBIN A1C  CALCIUM, IONIZED  BLOOD GAS, ARTERIAL  PROCALCITONIN  BLOOD GAS, ARTERIAL  CBC  MAGNESIUM  PHOSPHORUS  COMPREHENSIVE METABOLIC PANEL  TRIGLYCERIDES  PREPARE FRESH FROZEN PLASMA  ABO/RH  TYPE AND SCREEN  PREPARE PLATELET PHERESIS    EKG EKG Interpretation  Date/Time:  Monday April 27 2020 20:10:20 EDT Ventricular Rate:  96 PR Interval:  139 QRS Duration: 86 QT Interval:  400 QTC Calculation: 506 R Axis:   58 Text Interpretation: Sinus rhythm Prolonged QT interval No significant change since last tracing Confirmed by Aletta Edouard [phone]) on 05/02/2020 8:13:27 PM   Radiology   Procedures .Critical Care E&M Performed by: Luna Fuse, MD  Critical care provider statement:    Critical care time (minutes):  30   Critical care time was exclusive of:  Separately billable procedures and treating other patients After initial E/M assessment, critical care services were subsequently performed that were exclusive of separately billable procedures or treatment.       Medications Ordered in ED   ED Course  I have reviewed the triage vital signs and the nursing notes.  Pertinent labs & imaging results that were available during my care of the patient were reviewed by me and considered in my medical decision making (see chart for details).    MDM  Rules/Calculators/A&P                          Patient arrived severely hypothermic.  Placed on a warming blanket.  Sepsis level IV fluids initiated and broad-spectrum IV antibiotics initiated.  Labs returned showing evidence of acute kidney injury, elevated lactic acidosis.  CT scan of the brain shows no acute findings per radiology.  Chest x-ray otherwise clear per radiology.  Blood pressure remained stable and patient continues to maintain his airway.  ICU consulted for admission.  Addendum: Around 2 PM patient began to have slightly more continuous movement.  Still not following commands, but seen moving his eyes and turning his head slowly.  Final Clinical Impression(s) / ED Diagnoses Final diagnoses:  Sepsis, due to unspecified organism, unspecified whether acute organ dysfunction present [facility])    Rx / DC Orders ED Discharge Orders    None       Luna Fuse, MD 05/08/2020 [id number]    Luna Fuse, MD 04/20/2020 [phone]

## 2020-04-29 NOTE — Progress Notes (Signed)
NAME:  Terry Boyle, MRN:  [id number], DOB:  30-Oct-1955, LOS: 2 ADMISSION DATE:  05/01/2020, CONSULTATION DATE:  04/30/2020 REFERRING MD:  Forestine Na , CHIEF COMPLAINT:  Encephalopathy, Shock   History of Present Illness:  Terry Boyle was found down by neighbor who checks on him daily, last seen normal 04/26/20 PM.  Initially described as completely unresponsive, was hypothermic (initial temp 87.6). At 4pm hour described as making spontaneous movements, turning head, still without eye contact or blink to threat. Intubated at about 730pm for altered mental status. Episode of bloody stool after intubation at ED. Hypotension, started on levophed at around 7pm prior to intubation. Moderate volume BRBPR here on arrival in ICU. In ED received NS 3.5 L bolus total, zosyn/vanc, levophed.   CT head negative for acute.   Pertinent  Medical History  HTN, Chronic Hep B, Seizure, H/O Left Crani, Cirrhosis, H/O EGD with noted hepatic gastropathy and gastric ulcers.   Significant Hospital Events: Including procedures, antibiotic start and stop dates in addition to other pertinent events   . 4/18: Intubated. Shock. On levophed/vasopressin. Given 2 units FFP for INR 2.1  . 4/19 IVF boluses, albumin, NE requirement imrpoved, UOP picked up, Cordis placed, vent weaned  Interim History / Subjective:  Bps better with fluids, UOP robust.  Objective   Blood pressure (!) 103/53, pulse 84, temperature 98.06 F (36.7 C), resp. rate 18, height 6\' 1"  (1.854 m), weight 73 kg, SpO2 100 %.    Vent Mode: PRVC FiO2 (%):  [40 %] 40 % Set Rate:  [20 bmp-32 bmp] 20 bmp Vt Set:  [560 mL-630 mL] 560 mL PEEP:  [5 cmH20] 5 cmH20 Plateau Pressure:  [16 cmH20-23 cmH20] 23 cmH20   Intake/Output Summary (Last 24 hours) at 04/12/2020 2951 Last data filed at 05/04/2020 0503 Gross per 24 hour  Intake 4635.98 ml  Output 2475 ml  Net 2160.98 ml   Filed Weights   05/01/2020 1112 04/28/20 0500 05/06/2020 0500  Weight: 73 kg 72.8 kg 73  kg    Examination: General: Critically ill adult male on vent  HENT: ETT/OG in place  Lungs: Diminished to bases, vent assisted breaths Cardiovascular: RRR, no MRG Abdomen: thin, soft, active bowel sounds Extremities: -edema Neuro: sedated, PEERL   GU: foley in place   Labs/imaging that I havepersonally reviewed  (right click and "Reselect all SmartList Selections" daily)  Labs and Imaging   Resolved Hospital Problem list     Assessment & Plan:   Shock, multifactorial, no clear infectious process, GI bleed, Rhabdo  Plan -Titrate vasopressors for MAP goal >65 (currently on 9 mcg/min levophed and 0.04 vasopressin)  -Continue Zosyn - plan to end at 48 hrs if cultures negative -CT AP with mild edema of colon - colitis? Suspect related to volume, ischemia?  Encephalopathy, multifactorial with uremia, shock, and liver failure   -CT Head Negative for acute 4/18 -RASS -2, fentanyl/propofol --EEG negative  H/O Seizures  Plan -EEG negative -Renal Dose home Keppra  Acute GI Bleed Liver Cirrhosis > now with acute decompensation Chronic Hep B H/O EGD 2020 with portal hypertension gastropathy and gastric ulcers, no varices noted H/O Pancreatic Insufficieny   Plan -Consult GI - appreciate assitance -Continue NPO -Continue PPI and Octreotide  -Trandfuse Hgb > 7, plts > 50  Coagulopathy with shock state and liver dysfunction  Plan -fibrinogen > 200, not DIC -s/p Vit K, FFP  Acute Renal Injury secondary to hypoperfusion with severe dehydration and shock state - Improving Mild Rhabdo -  Improving Lactic Acidosis  Plan --stop continuous fluids, CK improving --UOP robust   Best practice (right click and "Reselect all SmartList Selections" daily)  Diet:  NPO Pain/Anxiety/Delirium protocol (if indicated): Yes (RASS goal -1) VAP protocol (if indicated): Yes DVT prophylaxis: SCD GI prophylaxis: PPI Glucose control:  SSI Yes Central venous access:  Yes, and it is still  needed Arterial line:  N/A Foley:  Yes, and it is still needed Mobility:  bed rest  PT consulted: N/A Last date of multidisciplinary goals of care discussion [4/19] Code Status:  limited Disposition: Sister updated on current condition and plan. States patient would not want to have extensive measures done. Discussed code status. Agree to DNR with no CPR or ACLS at this time. Wish to continue current medical care.     Critical care time:     CRITICAL CARE Performed by: Bonna Gains Rondell Pardon   Total critical care time: 40 minutes  Critical care time was exclusive of separately billable procedures and treating other patients.  Critical care was necessary to treat or prevent imminent or life-threatening deterioration.  Critical care was time spent personally by me on the following activities: development of treatment plan with patient and/or surrogate as well as nursing, discussions with consultants, evaluation of patient's response to treatment, examination of patient, obtaining history from patient or surrogate, ordering and performing treatments and interventions, ordering and review of laboratory studies, ordering and review of radiographic studies, pulse oximetry and re-evaluation of patient's condition.

## 2020-04-29 NOTE — Anesthesia Preprocedure Evaluation (Deleted)
Anesthesia Evaluation    Airway        Dental   Pulmonary           Cardiovascular      Neuro/Psych    GI/Hepatic   Endo/Other    Renal/GU      Musculoskeletal   Abdominal   Peds  Hematology   Anesthesia Other Findings   Reproductive/Obstetrics                              Anesthesia Physical Anesthesia Plan Anesthesia Quick Evaluation  

## 2020-04-29 NOTE — Progress Notes (Addendum)
[facility] Progress Note  Care transferred to [facility] today, from [place], as patient is established with Dr. Therisa Doyne [facility])   Terry Boyle 65 y.o. 12/25/1955  CC:  Rectal bleeding, shock liver  Subjective: Patient intubated and unable to provide any subjective data.  Per RN, 3 episodes of hematochezia yesterday.  No signs of bleeding thus far today.  ROS : Review of Systems  Unable to perform ROS: Intubated    Objective: Vital signs in last 24 hours: Vitals:   04/28/2020 0807 04/16/2020 0900  BP:  (!) 105/50  Pulse: 85 79  Resp: 14 15  Temp:  98.06 F (36.7 C)  SpO2: 100% 98%    Physical Exam:  General:  Intubated, unresponsive, ill-appearing  Head:  Normocephalic, without obvious abnormality, atraumatic  Eyes:  Anicteric sclera   Lungs:   Mechanically ventilated  Heart:  Regular rate and rhythm, S1, S2 normal  Abdomen:   Soft, non-tender (no grimace with palpation), sluggish bowel sounds    Lab Results: Recent Labs    04/28/20 1121 04/28/20 1533 04/28/2020 0358 05/04/2020 0800  NA 144 145 147* 145  K 4.3 3.7 3.1* 3.4*  CL 115* 115*  --  116*  CO2 10* 13*  --  18*  GLUCOSE 124* 156*  --  145*  BUN 137* 129*  --  116*  CREATININE 6.21* 5.59*  --  4.19*  CALCIUM 6.7* 6.5*  --  7.4*  MG 2.9*  --   --  2.8*  PHOS 6.7*  --   --  4.1   Recent Labs    04/28/20 1533 04/24/2020 0800  AST 1,604* 1,436*  ALT 1,019* 1,176*  ALKPHOS 71 79  BILITOT 2.1* 1.9*  PROT 4.9* 5.2*  ALBUMIN 3.0* 3.0*   Recent Labs    05/08/2020 1951 04/20/2020 2348 04/28/20 0012 04/28/20 1121 04/14/2020 0358  WBC 12.2* 13.4*  --  11.9*  --   NEUTROABS 10.2* 11.3*  --   --   --   HGB 12.2* 9.7*   < > 8.7* 8.2*  HCT 33.3* 27.3*   < > 24.3* 24.0*  MCV 102.1* 104.2*  --  102.5*  --   PLT 99* 106*  --  77*  77*  --    < > = values in this interval not displayed.   Recent Labs    04/24/2020 2348 04/28/20 1121  LABPROT 23.8* 26.7*  INR 2.1* 2.5*      Assessment: Rectal  bleeding: most likely related to ischemic colitis, though cannot rule out brisk upper GI bleed (portal hypertensive gastropathy, esophageal varices) -Hgb decreased from 11.2 to 8.2 yesterday.  Today's Hgb pending  Cirrhosis, shock liver: T. Bili 1.9/ AST 1436/ ALT 1176/ ALP 79. -PT 26.7/INR 2.5 as of 04/28/20  Acute renal failure: BUN 116/ Cr 4.19  Rhabdomyolysis  Plan: Proceed with bedside EGD today to rule out upper GI bleeding.  Discussed procedure with patient's sister, Delthine Hurtado, to include nature, alternatives, benefits, and risks (including but not limited to bleeding, infection, perforation).  She verbalized understanding and gave verbal consent to proceed with EGD.  Continue Protonix and octreotide.  Continue supportive care.  Addendum: INR resulted at 2.5 today - Vitamin K 10 mg IV ordered.  [facility] will follow.  Salley Slaughter PA-C 05/04/2020, 9:34 AM  Contact #  [phone]

## 2020-04-29 NOTE — Progress Notes (Signed)
[place] Progress Note Patient Name: Horald Birky DOB: December 12, 1955 MRN: [id number]   Date of Service  04/20/2020  HPI/Events of Note  Notified of ABG 7.54/19/108 on rate 32/630/5 PEEP. Patient not breathing over set rate. Peak pressure 26  eICU Interventions  Decrease rate to 20 and target TV 7 cc/kg IBW currently at 8. Discussed with RT     Intervention Category Intermediate Interventions: Diagnostic test evaluation  Judd Lien 04/16/2020, 4:13 AM

## 2020-04-30 ENCOUNTER — Encounter (HOSPITAL_COMMUNITY): Payer: Self-pay | Admitting: Gastroenterology

## 2020-04-30 DIAGNOSIS — R578 Other shock: Secondary | ICD-10-CM | POA: Diagnosis not present

## 2020-04-30 LAB — GLUCOSE, CAPILLARY
Glucose-Capillary: 131 mg/dL — ABNORMAL HIGH (ref 70–99)
Glucose-Capillary: 131 mg/dL — ABNORMAL HIGH (ref 70–99)
Glucose-Capillary: 139 mg/dL — ABNORMAL HIGH (ref 70–99)
Glucose-Capillary: 167 mg/dL — ABNORMAL HIGH (ref 70–99)
Glucose-Capillary: 143 mg/dL — ABNORMAL HIGH (ref 70–99)
Glucose-Capillary: 136 mg/dL — ABNORMAL HIGH (ref 70–99)

## 2020-04-30 LAB — BASIC METABOLIC PANEL
Anion gap: 9 (ref 5–15)
BUN: 75 mg/dL — ABNORMAL HIGH (ref 8–23)
CO2: 21 mmol/L — ABNORMAL LOW (ref 22–32)
Calcium: 7.9 mg/dL — ABNORMAL LOW (ref 8.9–10.3)
Chloride: 124 mmol/L — ABNORMAL HIGH (ref 98–111)
Creatinine, Ser: 2.68 mg/dL — ABNORMAL HIGH (ref 0.61–1.24)
GFR, Estimated: 26 mL/min — ABNORMAL LOW (ref >60)
Glucose, Bld: 141 mg/dL — ABNORMAL HIGH (ref 70–99)
Potassium: 4.2 mmol/L (ref 3.5–5.1)
Sodium: 154 mmol/L — ABNORMAL HIGH (ref 135–145)

## 2020-04-30 LAB — CBC
HCT: 28.2 % — ABNORMAL LOW (ref 39.0–52.0)
Hemoglobin: 9.4 g/dL — ABNORMAL LOW (ref 13.0–17.0)
MCH: 36.4 pg — ABNORMAL HIGH (ref 26.0–34.0)
MCHC: 33.3 g/dL (ref 30.0–36.0)
MCV: 109.3 fL — ABNORMAL HIGH (ref 80.0–100.0)
Platelets: 63 10*3/uL — ABNORMAL LOW (ref 150–400)
RBC: 2.58 MIL/uL — ABNORMAL LOW (ref 4.22–5.81)
RDW: 14.6 % (ref 11.5–15.5)
WBC: 13 10*3/uL — ABNORMAL HIGH (ref 4.0–10.5)
nRBC: 0.7 % — ABNORMAL HIGH (ref 0.0–0.2)

## 2020-04-30 MED ORDER — SODIUM CHLORIDE 0.9 % IV SOLN
1.0000 g | INTRAVENOUS | Status: AC
  Administered 2020-04-30 – 2020-05-01 (×2): 1 g via INTRAVENOUS
  Filled 2020-04-30: qty 10
  Filled 2020-04-30: qty 1

## 2020-04-30 MED ORDER — LACTULOSE 10 GM/15ML PO SOLN: 20.0000 g | Freq: Three times a day (TID) | ORAL | Status: DC

## 2020-04-30 MED ORDER — FAMOTIDINE 20 MG PO TABS
20.0000 mg | ORAL_TABLET | Freq: Two times a day (BID) | ORAL | Status: DC
  Administered 2020-04-30: 20 mg
  Filled 2020-04-30: qty 1

## 2020-04-30 MED ORDER — VITAL AF 1.2 CAL PO LIQD
1000.0000 mL | ORAL | Status: DC
  Administered 2020-04-30 – 2020-05-06 (×8): 1000 mL
  Filled 2020-04-30 (×3): qty 1000

## 2020-04-30 MED ORDER — VITAL HIGH PROTEIN PO LIQD
1000.0000 mL | ORAL | Status: DC
  Administered 2020-04-30: 1000 mL

## 2020-04-30 MED ORDER — LACTULOSE 10 GM/15ML PO SOLN
20.0000 g | Freq: Three times a day (TID) | ORAL | Status: DC
  Administered 2020-04-30 – 2020-05-06 (×19): 20 g
  Filled 2020-04-30 (×19): qty 30

## 2020-04-30 MED ORDER — FREE WATER
150.0000 mL | Freq: Four times a day (QID) | Status: DC
  Administered 2020-04-30 – 2020-05-01 (×3): 150 mL

## 2020-04-30 MED ORDER — INSULIN ASPART 100 UNIT/ML ~~LOC~~ SOLN
0.0000 [IU] | SUBCUTANEOUS | Status: DC
  Administered 2020-04-30 (×3): 1 [IU] via SUBCUTANEOUS
  Administered 2020-04-30 – 2020-05-01 (×2): 2 [IU] via SUBCUTANEOUS
  Administered 2020-05-01 (×4): 1 [IU] via SUBCUTANEOUS
  Administered 2020-05-02 – 2020-05-03 (×13): 2 [IU] via SUBCUTANEOUS
  Administered 2020-05-04: 3 [IU] via SUBCUTANEOUS
  Administered 2020-05-04: 2 [IU] via SUBCUTANEOUS

## 2020-04-30 MED ORDER — FAMOTIDINE 20 MG PO TABS
20.0000 mg | ORAL_TABLET | Freq: Every day | ORAL | Status: DC
  Administered 2020-05-01 – 2020-05-06 (×6): 20 mg
  Filled 2020-04-30 (×7): qty 1

## 2020-04-30 NOTE — Progress Notes (Signed)
Terry Boyle 9:48 AM  Subjective: Patient seen and examined and case discussed with the nurse and has had no signs of bleeding no new issues and they are weaning his fentanyl and he is off his octreotide  Objective: Vital signs stable afebrile no acute distress abdomen is soft nontender labs today pending  Assessment: Multiorgan failure including liver and ischemic colitis  Plan: Will check on tomorrow please call us sooner if question or problem from a GI standpoint  [facility]  office [phone] After 5PM or if no answer call [phone]

## 2020-04-30 NOTE — Progress Notes (Addendum)
Initial Nutrition Assessment  DOCUMENTATION CODES:   Not applicable  INTERVENTION:   Initiate tube feeding via OG tube: Vital AF 1.2 at 25 ml/h, increase by 10 ml every 8 hours to goal rate of 65 ml/h (1560 ml per day).  Provides 1872 kcal, 117 gm protein, 1265 ml free water daily.  Free water flushes 150 ml QID for total 1865 ml free water daily.  Monitor magnesium, potassium, and phosphorus levels, MD to replete as needed, as pt is at risk for refeeding syndrome given suspected malnutrition.  NUTRITION DIAGNOSIS:   Inadequate oral intake related to inability to eat as evidenced by NPO status.  GOAL:   Patient will meet greater than or equal to 90% of their needs  MONITOR:   Vent status,TF tolerance,Labs  REASON FOR ASSESSMENT:   Ventilator,Consult Enteral/tube feeding initiation and management  ASSESSMENT:   65 yo male admitted with encephalopathy, shock, hypothermia, GI bleed, rhabdomyolysis after being found down by neighbor. PMH includes HIV, seizure disorder, smoker.   Discussed patient in ICU rounds and with RN today. EEG and head CT results normal. EGD showed no bleed. Octreotide has been stopped.  Received MD Consult for TF initiation and management. OG tube in place.  Patient is currently intubated on ventilator support MV: 10.8 L/min Temp (24hrs), Avg:97.8 F (36.6 C), Min:96.5 F (35.8 C), Max:98.42 F (36.9 C)   Labs reviewed. Na 154 CBG: [id number]  Medications reviewed and include octreotide, protonix, levophed, IV Rocephin, Novolog SSI, lactulose, Keppra.   Weight history reviewed. No significant weight changes noted.  Patient has moderate to severe depletion of muscle mass. Suspect patient is malnourished, unable to obtain enough information at this time for identification of malnutrition.    NUTRITION - FOCUSED PHYSICAL EXAM:  Flowsheet Row Most Recent Value  Orbital Region Severe depletion  Upper Arm Region Unable to assess   Thoracic and Lumbar Region Unable to assess  Buccal Region Unable to assess  Temple Region Severe depletion  Clavicle Bone Region Moderate depletion  Clavicle and Acromion Bone Region Moderate depletion  Scapular Bone Region Moderate depletion  Dorsal Hand No depletion  Patellar Region No depletion  Anterior Thigh Region No depletion  Posterior Calf Region No depletion  Edema (RD Assessment) None  Hair Reviewed  Eyes Unable to assess  Mouth Unable to assess  Skin Reviewed  Nails Reviewed       Diet Order:   Diet Order            Diet NPO time specified  Diet effective now                 EDUCATION NEEDS:   Not appropriate for education at this time  Skin:  Skin Assessment: Reviewed RN Assessment (skin tear L knee)  Last BM:  4/19 type 7  Height:   Ht Readings from Last 1 Encounters:  05/05/2020 6\' 1"  (1.854 m)    Weight:   Wt Readings from Last 1 Encounters:  04/30/20 73.2 kg    Ideal Body Weight:  83.6 kg  BMI:  Body mass index is 21.29 kg/m.  Estimated Nutritional Needs:   Kcal:  1850  Protein:  110-130 gm  Fluid:  >/= 1.8 L    Lucas Mallow, RD, LDN, CNSC Please refer to Amion for contact information.

## 2020-04-30 NOTE — Progress Notes (Signed)
NAME:  Delphin Funes, MRN:  [id number], DOB:  1955-03-30, LOS: 3 ADMISSION DATE:  04/11/2020, CONSULTATION DATE:  04/28/2020 REFERRING MD:  Forestine Na , CHIEF COMPLAINT:  Encephalopathy, Shock   History of Present Illness:  Mr. Sawaya was found down by neighbor who checks on him daily, last seen normal 04/26/20 PM.  Initially described as completely unresponsive, was hypothermic (initial temp 87.6). At 4pm hour described as making spontaneous movements, turning head, still without eye contact or blink to threat. Intubated at about 730pm for altered mental status. Episode of bloody stool after intubation at ED. Hypotension, started on levophed at around 7pm prior to intubation. Moderate volume BRBPR here on arrival in ICU. In ED received NS 3.5 L bolus total, zosyn/vanc, levophed.   CT head negative for acute.   Pertinent  Medical History  HTN, Chronic Hep B, Seizure, H/O Left Crani, Cirrhosis, H/O EGD with noted hepatic gastropathy and gastric ulcers.   Significant Hospital Events:  . 4/18: Intubated. Shock. On levophed/vasopressin. Given 2 units FFP for INR 2.1  . 4/19 IVF boluses, albumin, NE requirement imrpoved, UOP picked up, Cordis placed, vent weaned . 4/20 weaned NE, uop good, zosyn stopped  Interim History / Subjective:  Pressors off, UOP robust.  Objective   Blood pressure (!) 106/56, pulse 75, temperature 97.88 F (36.6 C), resp. rate 20, height 6\' 1"  (1.854 m), weight 73.2 kg, SpO2 95 %.    Vent Mode: PRVC FiO2 (%):  [40 %] 40 % Set Rate:  [20 bmp] 20 bmp Vt Set:  [560 mL] 560 mL PEEP:  [5 cmH20] 5 cmH20 Plateau Pressure:  [11 cmH20-18 cmH20] 15 cmH20   Intake/Output Summary (Last 24 hours) at 04/30/2020 1040 Last data filed at 04/30/2020 0900 Gross per 24 hour  Intake 2564.81 ml  Output 2645 ml  Net -80.19 ml   Filed Weights   04/28/20 0500 04/10/2020 0500 04/30/20 0456  Weight: 72.8 kg 73 kg 73.2 kg    Examination: General: Critically ill adult male on vent   HENT: ETT/OG in place  Lungs: Diminished to bases, vent assisted breaths Cardiovascular: RRR, no MRG Abdomen: thin, soft, active bowel sounds Extremities: -edema Neuro: sedated, PEERL   GU: foley in place   Labs/imaging that I havepersonally reviewed  (right click and "Reselect all SmartList Selections" daily)  Labs and [facility] Problem list     Assessment & Plan:   Shock, multifactorial, no clear infectious process, GI bleed, Rhabdo  Plan -Titrate vasopressors for MAP goal >65 (currently on 9 mcg/min levophed and 0.04 vasopressin)  -s/p 48 hrs zosyn (ended 4/20) -CT AP with mild edema of colon - colitis? Suspect related to volume, ischemia?  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, no varices noted H/O Pancreatic Insufficieny   Plan -Consult GI - appreciate assitance -Continue NPO -Continue PPI and Octreotide  -Trandfuse Hgb > 7, plts > 50  Coagulopathy with shock state and liver dysfunction  Plan -fibrinogen > 200, not DIC -s/p Vit K, FFP  Acute Renal Injury secondary to hypoperfusion with severe dehydration and shock state - Improving Mild Rhabdo - Improving Lactic Acidosis  Plan --stop continuous fluids, CK improving --UOP robust   Best practice (right click and "Reselect all SmartList Selections" daily)  Diet:  NPO Pain/Anxiety/Delirium protocol (if indicated): Yes (RASS goal -1) VAP protocol (if indicated): Yes DVT prophylaxis: SCD GI prophylaxis: PPI Glucose control:  SSI Yes Central venous access:  Yes, and it is still needed Arterial  line:  N/A Foley:  Yes, and it is still needed Mobility:  bed rest  PT consulted: N/A Last date of multidisciplinary goals of care discussion [4/19] Code Status:  limited Disposition: Sister updated on current condition and plan. States patient would not want to have extensive measures done. Discussed code status. Agree to DNR with no CPR or ACLS at this time. Wish to continue current medical care. Re-address in next 48 hours pending mental status.    Critical care time:     CRITICAL CARE Performed by: [name]   Total critical care time: 32 minutes  Critical care time was exclusive of separately billable procedures and treating other patients.  Critical care was necessary to treat or prevent imminent or life-threatening deterioration.  Critical care was time spent personally by me on the following activities: development of treatment plan with patient and/or surrogate as well as nursing, discussions with consultants, evaluation of patient's response to treatment, examination of patient, obtaining history from patient or surrogate, ordering and performing treatments and interventions, ordering and review of laboratory studies, ordering and review of radiographic studies, pulse oximetry and re-evaluation of patient's condition.

## 2020-04-30 NOTE — Plan of Care (Signed)
  Problem: Elimination: Goal: Will not experience complications related to urinary retention Outcome: Progressing   Problem: Pain Managment: Goal: General experience of comfort will improve Outcome: Progressing   Problem: Skin Integrity: Goal: Risk for impaired skin integrity will decrease Outcome: Progressing   Problem: Respiratory: Goal: Ability to maintain a clear airway and adequate ventilation will improve Outcome: Progressing   Problem: Activity: Goal: Risk for activity intolerance will decrease Outcome: Not Progressing Note: Pt is unresponsive, intubated and critically ill. Unable to mobilize at this time.   Problem: Nutrition: Goal: Adequate nutrition will be maintained Outcome: Not Progressing Note: Due to multiple procedures, nutrition as not been able to be started. Will address with MD today during rounds.

## 2020-05-01 DIAGNOSIS — R578 Other shock: Secondary | ICD-10-CM | POA: Diagnosis not present

## 2020-05-01 LAB — CBC WITH DIFFERENTIAL/PLATELET
Abs Immature Granulocytes: 0.08 10*3/uL — ABNORMAL HIGH (ref 0.00–0.07)
Basophils Absolute: 0.1 10*3/uL (ref 0.0–0.1)
Basophils Relative: 1 %
Eosinophils Absolute: 0 10*3/uL (ref 0.0–0.5)
Eosinophils Relative: 0 %
HCT: 28.7 % — ABNORMAL LOW (ref 39.0–52.0)
Hemoglobin: 9.5 g/dL — ABNORMAL LOW (ref 13.0–17.0)
Immature Granulocytes: 1 %
Lymphocytes Relative: 3 %
Lymphs Abs: 0.6 10*3/uL — ABNORMAL LOW (ref 0.7–4.0)
MCH: 37.3 pg — ABNORMAL HIGH (ref 26.0–34.0)
MCHC: 33.1 g/dL (ref 30.0–36.0)
MCV: 112.5 fL — ABNORMAL HIGH (ref 80.0–100.0)
Monocytes Absolute: 1.1 10*3/uL — ABNORMAL HIGH (ref 0.1–1.0)
Monocytes Relative: 7 %
Neutro Abs: 14.6 10*3/uL — ABNORMAL HIGH (ref 1.7–7.7)
Neutrophils Relative %: 88 %
Platelets: UNDETERMINED 10*3/uL (ref 150–400)
RBC: 2.55 MIL/uL — ABNORMAL LOW (ref 4.22–5.81)
RDW: 15.6 % — ABNORMAL HIGH (ref 11.5–15.5)
WBC: 16.4 10*3/uL — ABNORMAL HIGH (ref 4.0–10.5)
nRBC: 0.7 % — ABNORMAL HIGH (ref 0.0–0.2)

## 2020-05-01 LAB — BASIC METABOLIC PANEL
BUN: 68 mg/dL — ABNORMAL HIGH (ref 8–23)
CO2: 22 mmol/L (ref 22–32)
Calcium: 7.8 mg/dL — ABNORMAL LOW (ref 8.9–10.3)
Chloride: >130 mmol/L (ref 98–111)
Creatinine, Ser: 2.27 mg/dL — ABNORMAL HIGH (ref 0.61–1.24)
GFR, Estimated: 32 mL/min — ABNORMAL LOW (ref >60)
Glucose, Bld: 159 mg/dL — ABNORMAL HIGH (ref 70–99)
Potassium: 3.8 mmol/L (ref 3.5–5.1)
Sodium: 160 mmol/L — ABNORMAL HIGH (ref 135–145)

## 2020-05-01 LAB — GLUCOSE, CAPILLARY
Glucose-Capillary: 129 mg/dL — ABNORMAL HIGH (ref 70–99)
Glucose-Capillary: 139 mg/dL — ABNORMAL HIGH (ref 70–99)
Glucose-Capillary: 141 mg/dL — ABNORMAL HIGH (ref 70–99)
Glucose-Capillary: 149 mg/dL — ABNORMAL HIGH (ref 70–99)
Glucose-Capillary: 165 mg/dL — ABNORMAL HIGH (ref 70–99)
Glucose-Capillary: 178 mg/dL — ABNORMAL HIGH (ref 70–99)

## 2020-05-01 LAB — HEPATIC FUNCTION PANEL
ALT: 802 U/L — ABNORMAL HIGH (ref 0–44)
AST: 395 U/L — ABNORMAL HIGH (ref 15–41)
Albumin: 2.7 g/dL — ABNORMAL LOW (ref 3.5–5.0)
Alkaline Phosphatase: 88 U/L (ref 38–126)
Bilirubin, Direct: 1.1 mg/dL — ABNORMAL HIGH (ref 0.0–0.2)
Indirect Bilirubin: 1.2 mg/dL — ABNORMAL HIGH (ref 0.3–0.9)
Total Bilirubin: 2.3 mg/dL — ABNORMAL HIGH (ref 0.3–1.2)
Total Protein: 5.2 g/dL — ABNORMAL LOW (ref 6.5–8.1)

## 2020-05-01 LAB — HEPATITIS B DNA, ULTRAQUANTITATIVE, PCR
HBV DNA SERPL PCR-ACNC: 690 IU/mL
HBV DNA SERPL PCR-LOG IU: 2.839 log10 IU/mL

## 2020-05-01 MED ORDER — FREE WATER
100.0000 mL | Status: DC
  Administered 2020-05-01 – 2020-05-02 (×35): 100 mL

## 2020-05-01 MED ORDER — LEVETIRACETAM 100 MG/ML PO SOLN
750.0000 mg | Freq: Two times a day (BID) | ORAL | Status: DC
  Administered 2020-05-01 – 2020-05-06 (×12): 750 mg
  Filled 2020-05-01 (×13): qty 10

## 2020-05-01 NOTE — Progress Notes (Signed)
eLink Physician-Brief Progress Note Patient Name: Terry Boyle DOB: 04-08-1955 MRN: [id number]   Date of Service  05/01/2020  HPI/Events of Note  Nursing request for CBC this AM.   eICU Interventions  Will order CBC with platelets now.      Intervention Category Major Interventions: Other:  Lysle Dingwall 05/01/2020, 5:15 AM

## 2020-05-01 NOTE — Progress Notes (Signed)
Took warming blanket off.

## 2020-05-01 NOTE — Progress Notes (Signed)
[facility] Progress Note    Aiken Withem 65 y.o. 1955-02-20  CC:  Rectal bleeding, shock liver  Subjective: Patient intubated and unable to provide any subjective data.    Per flow sheet, two brown/red liquid BM this mornings.  ROS : Review of Systems  Unable to perform ROS: Intubated    Objective: Vital signs in last 24 hours: Vitals:   05/01/20 0748 05/01/20 0800  BP:  (!) 101/55  Pulse:  77  Resp:  20  Temp: (!) 97.4 F (36.3 C)   SpO2:  94%    Physical Exam:  General:  Intubated, unresponsive, ill-appearing  Head:  Normocephalic, without obvious abnormality, atraumatic  Eyes:  Anicteric sclera   Lungs:   Mechanically ventilated  Heart:  Regular rate and rhythm, S1, S2 normal  Abdomen:   Soft, non-tender (no grimace with palpation), sluggish bowel sounds    Lab Results: Recent Labs    04/28/20 1121 04/28/20 1533 04/22/2020 0800 04/30/20 0915 05/01/20 0711  NA 144   < > 145 154* 160*  K 4.3   < > 3.4* 4.2 3.8  CL 115*   < > 116* 124* >130*  CO2 10*   < > 18* 21* 22  GLUCOSE 124*   < > 145* 141* 159*  BUN 137*   < > 116* 75* 68*  CREATININE 6.21*   < > 4.19* 2.68* 2.27*  CALCIUM 6.7*   < > 7.4* 7.9* 7.8*  MG 2.9*  --  2.8*  --   --   PHOS 6.7*  --  4.1  --   --    < > = values in this interval not displayed.   Recent Labs    04/28/20 1533 04/12/2020 0800  AST 1,604* 1,436*  ALT 1,019* 1,176*  ALKPHOS 71 79  BILITOT 2.1* 1.9*  PROT 4.9* 5.2*  ALBUMIN 3.0* 3.0*   Recent Labs    04/30/20 0915 05/01/20 0533  WBC 13.0* 16.4*  NEUTROABS  --  14.6*  HGB 9.4* 9.5*  HCT 28.2* 28.7*  MCV 109.3* 112.5*  PLT 63* PLATELET CLUMPS NOTED ON SMEAR, UNABLE TO ESTIMATE   Recent Labs    04/28/20 1121 04/20/2020 1005  LABPROT 26.7* 26.4*  INR 2.5* 2.4*      Assessment: Rectal bleeding: most likely related to ischemic colitis.  EGD unrevealing for source of bleeding. -Hgb 9.5, stable  Cirrhosis, shock liver: T. Bili 1.9/ AST 1436/ ALT  1176/ ALP 79 as of 4/20, repeat LFTs pending -PT 26.4/INR 2.4 as of 04/26/2020 -Hepatitis B quant pending  Acute renal failure: BUN 68/Cr 2.27, improving  Rhabdomyolysis  Plan: Continue supportive care.  Continue to monitor H&H with transfusion as needed to maintain Hgb >7.  [facility] will follow at a distance; we will revisit Monday.  Please call us sooner if needed.   Salley Slaughter PA-C 05/01/2020, 8:49 AM  Contact #  [phone]

## 2020-05-01 NOTE — Progress Notes (Signed)
NAME:  Terry Boyle, MRN:  [id number], DOB:  Oct 16, 1955, LOS: 4 ADMISSION DATE:  04/23/2020, CONSULTATION DATE:  04/10/2020 REFERRING MD:  Forestine Na , CHIEF COMPLAINT:  Encephalopathy, Shock   History of Present Illness:  Terry Boyle was found down by neighbor who checks on him daily, last seen normal 04/26/20 PM.  Initially described as completely unresponsive, was hypothermic (initial temp 87.6). At 4pm hour described as making spontaneous movements, turning head, still without eye contact or blink to threat. Intubated at about 730pm for altered mental status. Episode of bloody stool after intubation at ED. Hypotension, started on levophed at around 7pm prior to intubation. Moderate volume BRBPR here on arrival in ICU. In ED received NS 3.5 L bolus total, zosyn/vanc, levophed.   CT head negative for acute.   Pertinent  Medical History  HTN, Chronic Hep B, Seizure, H/O Left Crani, Cirrhosis, H/O EGD with noted hepatic gastropathy and gastric ulcers.   Significant Hospital Events:  . 4/18: Intubated. Shock. On levophed/vasopressin. Given 2 units FFP for INR 2.1  . 4/19 IVF boluses, albumin, NE requirement imrpoved, UOP picked up, Cordis placed, vent weaned . 4/20 weaned NE, uop good, zosyn stopped . 4/21 Encephalopathic, unable to extubate  Interim History / Subjective:  Withdraws to pain, opens eyes briefly, better than before but still mental status very poor  Objective   Blood pressure (!) 101/55, pulse 77, temperature (!) 97.4 F (36.3 C), temperature source Axillary, resp. rate 20, height 6\' 1"  (1.854 m), weight 73.2 kg, SpO2 94 %.    Vent Mode: PRVC FiO2 (%):  [40 %] 40 % Set Rate:  [20 bmp] 20 bmp Vt Set:  [560 mL] 560 mL PEEP:  [5 cmH20] 5 cmH20 Plateau Pressure:  [16 cmH20-18 cmH20] 16 cmH20   Intake/Output Summary (Last 24 hours) at 05/01/2020 4008 Last data filed at 05/01/2020 6761 Gross per 24 hour  Intake 1284.84 ml  Output 1225 ml  Net 59.84 ml   Filed Weights    04/28/20 0500 04/15/2020 0500 04/30/20 0456  Weight: 72.8 kg 73 kg 73.2 kg    Examination: General: Critically ill adult male on vent  HENT: ETT/OG in place  Lungs: Diminished to bases, vent assisted breaths Cardiovascular: RRR, no MRG Abdomen: thin, soft, active bowel sounds Extremities: -edema Neuro: sedated, PEERL   GU: foley in place   Labs/imaging that I havepersonally reviewed  (right click and "Reselect all SmartList Selections" daily)  Labs and Imaging   Resolved Hospital Problem list   Hemorrhagic Shock  Assessment & Plan:   Encephalopathy, multifactorial with uremia, shock, hypernatremia  -CT Head Negative for acute 4/18 --EEG negative --Stop all sedation, analgesia, monitor for return of function --lactulose TID - having adequate BMs  H/O Seizures  Plan -EEG negative -Renal Dose home Keppra  Acute GI Bleed Liver Cirrhosis > now with acute decompensation Chronic Hep B H/O EGD 2020 with portal hypertension gastropathy and gastric ulcers, no varices noted H/O Pancreatic Insufficieny   Plan --TFs -Trandfuse Hgb > 7, plts > 50  Coagulopathy with shock state and liver dysfunction  Plan -fibrinogen > 200, not DIC -s/p Vit K, FFP  Acute Renal Injury secondary to hypoperfusion with severe dehydration and shock state - Improving Mild Rhabdo - Improving  Plan --UOP ok  Hypernatremia: High salt load with IVF resuscitation, lack of intake --Free water flushes  Best practice (right click and "Reselect all SmartList Selections" daily)  Diet:  NPO Pain/Anxiety/Delirium protocol (if indicated): Yes (RASS goal -1)  VAP protocol (if indicated): Yes DVT prophylaxis: SCD GI prophylaxis: PPI Glucose control:  SSI Yes Central venous access:  Yes, and it is still needed Arterial line:  N/A Foley:  Yes, and it is still needed Mobility:  bed rest  PT consulted: N/A Last date of multidisciplinary goals of care discussion 4/23 Code Status:  limited Disposition:  Sister updated on current condition and plan 4/21. States patient would not want to have extensive measures done. Discussed code status. Agree to DNR with no CPR or ACLS at this time. Wish to continue current medical care. Re-address in next 48 hours pending mental status.    Critical care time:     CRITICAL CARE Performed by: [name]   Total critical care time: 33 minutes  Critical care time was exclusive of separately billable procedures and treating other patients.  Critical care was necessary to treat or prevent imminent or life-threatening deterioration.  Critical care was time spent personally by me on the following activities: development of treatment plan with patient and/or surrogate as well as nursing, discussions with consultants, evaluation of patient's response to treatment, examination of patient, obtaining history from patient or surrogate, ordering and performing treatments and interventions, ordering and review of laboratory studies, ordering and review of radiographic studies, pulse oximetry and re-evaluation of patient's condition.

## 2020-05-02 DIAGNOSIS — R578 Other shock: Secondary | ICD-10-CM | POA: Diagnosis not present

## 2020-05-02 LAB — BASIC METABOLIC PANEL
Anion gap: UNDETERMINED (ref 5–15)
BUN: 80 mg/dL — ABNORMAL HIGH (ref 8–23)
CO2: 22 mmol/L (ref 22–32)
Calcium: 7.7 mg/dL — ABNORMAL LOW (ref 8.9–10.3)
Chloride: >130 mmol/L (ref 98–111)
Creatinine, Ser: 2.71 mg/dL — ABNORMAL HIGH (ref 0.61–1.24)
GFR, Estimated: 26 mL/min — ABNORMAL LOW (ref >60)
Glucose, Bld: 193 mg/dL — ABNORMAL HIGH (ref 70–99)
Potassium: 3.6 mmol/L (ref 3.5–5.1)
Sodium: 161 mmol/L (ref 135–145)

## 2020-05-02 LAB — CULTURE, BLOOD (ROUTINE X 2)
Culture: NO GROWTH
Culture: NO GROWTH
Special Requests: ADEQUATE

## 2020-05-02 LAB — GLUCOSE, CAPILLARY
Glucose-Capillary: 159 mg/dL — ABNORMAL HIGH (ref 70–99)
Glucose-Capillary: 165 mg/dL — ABNORMAL HIGH (ref 70–99)
Glucose-Capillary: 173 mg/dL — ABNORMAL HIGH (ref 70–99)
Glucose-Capillary: 177 mg/dL — ABNORMAL HIGH (ref 70–99)
Glucose-Capillary: 179 mg/dL — ABNORMAL HIGH (ref 70–99)
Glucose-Capillary: 181 mg/dL — ABNORMAL HIGH (ref 70–99)

## 2020-05-02 LAB — TRIGLYCERIDES: Triglycerides: 88 mg/dL (ref <150)

## 2020-05-02 MED ORDER — FREE WATER
200.0000 mL | Status: DC
  Administered 2020-05-02 – 2020-05-05 (×54): 200 mL

## 2020-05-02 MED ORDER — DEXTROSE 5 % IV SOLN: INTRAVENOUS | Status: DC

## 2020-05-02 NOTE — Progress Notes (Addendum)
eLink Physician-Brief Progress Note Patient Name: Terry Boyle DOB: [birth year] MRN: [id number]   Date of Service  05/02/2020  HPI/Events of Note  Patient with severe and intractable hypernatremia, serum sodium is rising despite 100 ml / hour of enteric free water, Free water increased to 200 ml /hour and order instructions clarified.  eICU Interventions  See above. Morning labs ordered.        Frederik Pear 05/02/2020, 8:39 PM

## 2020-05-02 NOTE — Progress Notes (Signed)
NAME:  Terry Boyle, MRN:  [id number], DOB:  20-Apr-1955, LOS: 5 ADMISSION DATE:  05/01/2020, CONSULTATION DATE:  04/22/2020 REFERRING MD:  Forestine Na , CHIEF COMPLAINT:  Encephalopathy, Shock   History of Present Illness:  Terry Boyle was found down by neighbor who checks on him daily, last seen normal 04/26/20 PM.  Initially described as completely unresponsive, was hypothermic (initial temp 87.6). At 4pm hour described as making spontaneous movements, turning head, still without eye contact or blink to threat. Intubated at about 730pm for altered mental status. Episode of bloody stool after intubation at ED. Hypotension, started on levophed at around 7pm prior to intubation. Moderate volume BRBPR here on arrival in ICU. In ED received NS 3.5 L bolus total, zosyn/vanc, levophed.   CT head negative for acute.   Pertinent  Medical History  HTN, Chronic Hep B, Seizure, H/O Left Crani, Cirrhosis, H/O EGD with noted hepatic gastropathy and gastric ulcers.   Significant Hospital Events:  . 4/18: Intubated. Shock. On levophed/vasopressin. Given 2 units FFP for INR 2.1  . 4/19 IVF boluses, albumin, NE requirement imrpoved, UOP picked up, Cordis placed, vent weaned . 4/20 weaned NE, uop good, zosyn stopped . 4/21 Encephalopathic, unable to extubate . 4/22 Slight improvement in MS . 4/23 follows commands  Interim History / Subjective:  Followed commands, still floridly encephalopathic  Objective   Blood pressure 119/70, pulse 77, temperature (!) 96.6 F (35.9 C), temperature source Axillary, resp. rate 20, height 6\' 1"  (1.854 m), weight 80 kg, SpO2 98 %.    Vent Mode: PRVC FiO2 (%):  [40 %] 40 % Set Rate:  [20 bmp-28 bmp] 28 bmp Vt Set:  [560 mL] 560 mL PEEP:  [5 cmH20] 5 cmH20 Plateau Pressure:  [13 cmH20-16 cmH20] 16 cmH20   Intake/Output Summary (Last 24 hours) at 05/02/2020 1009 Last data filed at 05/02/2020 0350 Gross per 24 hour  Intake 2070 ml  Output 1375 ml  Net 695 ml   Filed  Weights   04/22/2020 0500 04/30/20 0456 05/02/20 0404  Weight: 73 kg 73.2 kg 80 kg    Examination: General: Critically ill adult male on vent  HENT: ETT/OG in place  Lungs: Diminished to bases, vent assisted breaths Cardiovascular: RRR, no MRG Abdomen: thin, soft, active bowel sounds Extremities: -edema Neuro: sedated, PEERL   GU: foley in place   Labs/imaging that I havepersonally reviewed  (right click and "Reselect all SmartList Selections" daily)  Labs and Imaging   Resolved Hospital Problem list   Hemorrhagic Shock  Assessment & Plan:   Encephalopathy, multifactorial with uremia, shock, hypernatremia  -CT Head Negative for acute 4/18 --EEG negative --Stop all sedation, analgesia, monitor for return of function --lactulose TID - having adequate BMs --followed commands 4/23 --D5, free water, Na not improved yet  H/O Seizures  Plan -EEG negative -Renal Dose home Keppra  Acute GI Bleed Liver Cirrhosis > now with acute decompensation Chronic Hep B H/O EGD 2020 with portal hypertension gastropathy and gastric ulcers, no varices noted H/O Pancreatic Insufficieny   Plan --TFs --Transfuse Hgb > 7, plts > 50  Coagulopathy with shock state and liver dysfunction  Plan -fibrinogen > 200, not DIC -s/p Vit K, FFP  Acute Renal Injury secondary to hypoperfusion with severe dehydration and shock state - Improving Mild Rhabdo - Improving  Plan --UOP ok --Cr in 2s  Hypernatremia: High salt load with IVF resuscitation, lack of intake --Free water flushes --D5 added  Best practice (right click and "Reselect all  SmartList Selections" daily)  Diet:  Tube Feed  Pain/Anxiety/Delirium protocol (if indicated): Yes (RASS goal -1) VAP protocol (if indicated): Yes DVT prophylaxis: SCD GI prophylaxis: PPI Glucose control:  SSI Yes Central venous access:  N/A Arterial line:  N/A Foley:  Yes, and it is still needed Mobility:  bed rest  PT consulted: N/A Last date of  multidisciplinary goals of care discussion 4/23 Code Status:  limited Disposition: Sister updated on current condition and plan 4/23. DNR - reassess mental status 4/25 and considering comfort care if mental status not imrpoved.    Critical care time:     CRITICAL CARE Performed by: Terry Boyle  Total critical care time: 35 minutes  Critical care time was exclusive of separately billable procedures and treating other patients.  Critical care was necessary to treat or prevent imminent or life-threatening deterioration.  Critical care was time spent personally by me on the following activities: development of treatment plan with patient and/or surrogate as well as nursing, discussions with consultants, evaluation of patient's response to treatment, examination of patient, obtaining history from patient or surrogate, ordering and performing treatments and interventions, ordering and review of laboratory studies, ordering and review of radiographic studies, pulse oximetry and re-evaluation of patient's condition.   Terry Clam, MD See Terry Boyle for contact info

## 2020-05-03 DIAGNOSIS — R6520 Severe sepsis without septic shock: Secondary | ICD-10-CM | POA: Diagnosis not present

## 2020-05-03 DIAGNOSIS — A419 Sepsis, unspecified organism: Secondary | ICD-10-CM | POA: Diagnosis not present

## 2020-05-03 LAB — CBC WITH DIFFERENTIAL/PLATELET
Abs Immature Granulocytes: 0.21 10*3/uL — ABNORMAL HIGH (ref 0.00–0.07)
Basophils Absolute: 0.1 10*3/uL (ref 0.0–0.1)
Basophils Relative: 0 %
Eosinophils Absolute: 0.1 10*3/uL (ref 0.0–0.5)
Eosinophils Relative: 1 %
HCT: 26.8 % — ABNORMAL LOW (ref 39.0–52.0)
Hemoglobin: 9 g/dL — ABNORMAL LOW (ref 13.0–17.0)
Immature Granulocytes: 1 %
Lymphocytes Relative: 5 %
Lymphs Abs: 0.8 10*3/uL (ref 0.7–4.0)
MCH: 37 pg — ABNORMAL HIGH (ref 26.0–34.0)
MCHC: 33.6 g/dL (ref 30.0–36.0)
MCV: 110.3 fL — ABNORMAL HIGH (ref 80.0–100.0)
Monocytes Absolute: 1 10*3/uL (ref 0.1–1.0)
Monocytes Relative: 6 %
Neutro Abs: 15.2 10*3/uL — ABNORMAL HIGH (ref 1.7–7.7)
Neutrophils Relative %: 87 %
Platelets: 29 10*3/uL — CL (ref 150–400)
RBC: 2.43 MIL/uL — ABNORMAL LOW (ref 4.22–5.81)
RDW: 16.3 % — ABNORMAL HIGH (ref 11.5–15.5)
WBC: 17.5 10*3/uL — ABNORMAL HIGH (ref 4.0–10.5)
nRBC: 1.4 % — ABNORMAL HIGH (ref 0.0–0.2)

## 2020-05-03 LAB — COMPREHENSIVE METABOLIC PANEL
ALT: 457 U/L — ABNORMAL HIGH (ref 0–44)
AST: 130 U/L — ABNORMAL HIGH (ref 15–41)
Albumin: 2.1 g/dL — ABNORMAL LOW (ref 3.5–5.0)
Alkaline Phosphatase: 101 U/L (ref 38–126)
Anion gap: 5 (ref 5–15)
BUN: 56 mg/dL — ABNORMAL HIGH (ref 8–23)
CO2: 23 mmol/L (ref 22–32)
Calcium: 7.6 mg/dL — ABNORMAL LOW (ref 8.9–10.3)
Chloride: 130 mmol/L — ABNORMAL HIGH (ref 98–111)
Creatinine, Ser: 1.78 mg/dL — ABNORMAL HIGH (ref 0.61–1.24)
GFR, Estimated: 43 mL/min — ABNORMAL LOW (ref >60)
Glucose, Bld: 189 mg/dL — ABNORMAL HIGH (ref 70–99)
Potassium: 3.4 mmol/L — ABNORMAL LOW (ref 3.5–5.1)
Sodium: 158 mmol/L — ABNORMAL HIGH (ref 135–145)
Total Bilirubin: 2.5 mg/dL — ABNORMAL HIGH (ref 0.3–1.2)
Total Protein: 4.7 g/dL — ABNORMAL LOW (ref 6.5–8.1)

## 2020-05-03 LAB — GLUCOSE, CAPILLARY
Glucose-Capillary: 172 mg/dL — ABNORMAL HIGH (ref 70–99)
Glucose-Capillary: 167 mg/dL — ABNORMAL HIGH (ref 70–99)
Glucose-Capillary: 173 mg/dL — ABNORMAL HIGH (ref 70–99)
Glucose-Capillary: 171 mg/dL — ABNORMAL HIGH (ref 70–99)
Glucose-Capillary: 200 mg/dL — ABNORMAL HIGH (ref 70–99)
Glucose-Capillary: 207 mg/dL — ABNORMAL HIGH (ref 70–99)
Glucose-Capillary: 188 mg/dL — ABNORMAL HIGH (ref 70–99)

## 2020-05-03 LAB — MAGNESIUM: Magnesium: 3 mg/dL — ABNORMAL HIGH (ref 1.7–2.4)

## 2020-05-03 LAB — SAVE SMEAR(SSMR), FOR PROVIDER SLIDE REVIEW

## 2020-05-03 MED ORDER — POTASSIUM CHLORIDE 20 MEQ PO PACK
40.0000 meq | PACK | Freq: Once | ORAL | Status: AC
  Administered 2020-05-03: 40 meq
  Filled 2020-05-03: qty 2

## 2020-05-03 NOTE — Progress Notes (Signed)
Date and time results received: 05/03/20 4:47 AM  Test: Platelet Critical Value: 29  Name of Provider Notified: Lourdes Sledge, RN  Orders Received? Or Actions Taken?: Orders Received - See Orders for details

## 2020-05-03 NOTE — Progress Notes (Signed)
NAME:  Olan Kurek, MRN:  [id number], DOB:  11-13-1955, LOS: 6 ADMISSION DATE:  05/03/2020, CONSULTATION DATE:  05/04/2020 REFERRING MD:  Forestine Na , CHIEF COMPLAINT:  Encephalopathy, Shock   History of Present Illness:  Mr. Hiltner was found down by neighbor who checks on him daily, last seen normal 04/26/20 PM.  Initially described as completely unresponsive, was hypothermic (initial temp 87.6). At 4pm hour described as making spontaneous movements, turning head, still without eye contact or blink to threat. Intubated at about 730pm for altered mental status. Episode of bloody stool after intubation at ED. Hypotension, started on levophed at around 7pm prior to intubation. Moderate volume BRBPR here on arrival in ICU. In ED received NS 3.5 L bolus total, zosyn/vanc, levophed.   CT head negative for acute.   Pertinent  Medical History  HTN, Chronic Hep B, Seizure, H/O Left Crani, Cirrhosis, H/O EGD with noted hepatic gastropathy and gastric ulcers.   Significant Hospital Events:  . 4/18: Intubated. Shock. On levophed/vasopressin. Given 2 units FFP for INR 2.1  . 4/19 IVF boluses, albumin, NE requirement imrpoved, UOP picked up, Cordis placed, vent weaned . 4/20 weaned NE, uop good, zosyn stopped . 4/21 Encephalopathic, unable to extubate . 4/22 Slight improvement in MS . 4/23 follows commands . 4/24 not following commands  Interim History / Subjective:  Not following commands today, wont arouse to voice, Na coming down  Objective   Blood pressure 116/60, pulse 98, temperature 98.5 F (36.9 C), temperature source Axillary, resp. rate 13, height 6\' 1"  (1.854 m), weight 80.3 kg, SpO2 98 %.    Vent Mode: PRVC FiO2 (%):  [40 %] 40 % Set Rate:  [14 bmp] 14 bmp Vt Set:  [560 mL] 560 mL PEEP:  [5 cmH20] 5 cmH20 Plateau Pressure:  [11 cmH20-14 cmH20] 14 cmH20   Intake/Output Summary (Last 24 hours) at 05/03/2020 0915 Last data filed at 05/03/2020 0800 Gross per 24 hour  Intake 6357.75 ml   Output 2550 ml  Net 3807.75 ml   Filed Weights   04/30/20 0456 05/02/20 0404 05/03/20 0500  Weight: 73.2 kg 80 kg 80.3 kg    Examination: General: Critically ill adult male on vent  HENT: ETT/OG in place  Lungs: Diminished to bases, vent assisted breaths Cardiovascular: RRR, no MRG Abdomen: thin, soft, active bowel sounds Extremities: -edema Neuro: sedated, PEERL, not arousing to voice   GU: foley in place   Labs/imaging that I havepersonally reviewed  (right click and "Reselect all SmartList Selections" daily)  Labs and Imaging   Resolved Hospital Problem list   Hemorrhagic Shock  Assessment & Plan:   Encephalopathy, multifactorial with uremia, shock, hypernatremia  -CT Head Negative for acute 4/18 --EEG negative --All sedation stopped 4/21, analgesia, monitor for return of function --lactulose TID - having adequate BMs --followed commands 4/23 but not 4/24 --D5, free water, Na slowly coming down  H/O Seizures  Plan -EEG negative -Renal Dose home Keppra  Acute GI Bleed Resolved - EGD was clear Liver Cirrhosis > now with acute decompensation Chronic Hep B H/O EGD 2020 with portal hypertension gastropathy and gastric ulcers, no varices noted H/O Pancreatic Insufficieny   Plan --TFs --Transfuse Hgb > 7, plts > 10  Coagulopathy with shock state and liver dysfunction  Plan -fibrinogen > 200, not DIC -s/p Vit K, FFP  Acute Renal Injury secondary to hypoperfusion with severe dehydration and shock state - Improving Mild Rhabdo - Improving  Plan --UOP ok --Cr coming down  Hypernatremia:  High salt load with IVF resuscitation, lack of intake --Free water flushes --D5 added 4/23  Best practice (right click and "Reselect all SmartList Selections" daily)  Diet:  Tube Feed  Pain/Anxiety/Delirium protocol (if indicated): Yes (RASS goal -1) VAP protocol (if indicated): Yes DVT prophylaxis: SCD GI prophylaxis: PPI Glucose control:  SSI Yes Central venous  access:  N/A Arterial line:  N/A Foley:  Yes, and it is still needed Mobility:  bed rest  PT consulted: N/A Last date of multidisciplinary goals of care discussion 4/23 Code Status:  limited Disposition: Sister updated on current condition and plan 4/24. DNR - reassess mental status 4/25 and considering comfort care if mental status not improved.    Critical care time:     CRITICAL CARE Performed by: [name]  Total critical care time: 32 minutes  Critical care time was exclusive of separately billable procedures and treating other patients.  Critical care was necessary to treat or prevent imminent or life-threatening deterioration.  Critical care was time spent personally by me on the following activities: development of treatment plan with patient and/or surrogate as well as nursing, discussions with consultants, evaluation of patient's response to treatment, examination of patient, obtaining history from patient or surrogate, ordering and performing treatments and interventions, ordering and review of laboratory studies, ordering and review of radiographic studies, pulse oximetry and re-evaluation of patient's condition.   [name], MD See Shea Evans for contact info

## 2020-05-03 NOTE — Progress Notes (Signed)
[place] Progress Note Patient Name: Terry Boyle DOB: 1955/07/06 MRN: [id number]   Date of Service  05/03/2020  HPI/Events of Note  Platelet count 29 K, no overt evidence of bleeding. Patient with advanced cirrhosis and portal hypertension.  eICU Interventions  Trend platelet count and transfuse for platelet count < 10 K or active bleeding.        Kerry Kass Amelita Risinger 05/03/2020, 4:57 AM

## 2020-05-04 ENCOUNTER — Inpatient Hospital Stay (HOSPITAL_COMMUNITY): Payer: Medicaid Other

## 2020-05-04 DIAGNOSIS — J9601 Acute respiratory failure with hypoxia: Secondary | ICD-10-CM | POA: Diagnosis not present

## 2020-05-04 DIAGNOSIS — G931 Anoxic brain damage, not elsewhere classified: Secondary | ICD-10-CM | POA: Diagnosis not present

## 2020-05-04 DIAGNOSIS — A419 Sepsis, unspecified organism: Secondary | ICD-10-CM | POA: Diagnosis not present

## 2020-05-04 LAB — CBC
HCT: 26.7 % — ABNORMAL LOW (ref 39.0–52.0)
Hemoglobin: 9.1 g/dL — ABNORMAL LOW (ref 13.0–17.0)
MCH: 37 pg — ABNORMAL HIGH (ref 26.0–34.0)
MCHC: 34.1 g/dL (ref 30.0–36.0)
MCV: 108.5 fL — ABNORMAL HIGH (ref 80.0–100.0)
Platelets: 23 10*3/uL — CL (ref 150–400)
RBC: 2.46 MIL/uL — ABNORMAL LOW (ref 4.22–5.81)
RDW: 16.6 % — ABNORMAL HIGH (ref 11.5–15.5)
WBC: 18.6 10*3/uL — ABNORMAL HIGH (ref 4.0–10.5)
nRBC: 0.9 % — ABNORMAL HIGH (ref 0.0–0.2)

## 2020-05-04 LAB — BASIC METABOLIC PANEL
Anion gap: 6 (ref 5–15)
BUN: 44 mg/dL — ABNORMAL HIGH (ref 8–23)
CO2: 24 mmol/L (ref 22–32)
Calcium: 7.5 mg/dL — ABNORMAL LOW (ref 8.9–10.3)
Chloride: 117 mmol/L — ABNORMAL HIGH (ref 98–111)
Creatinine, Ser: 1.46 mg/dL — ABNORMAL HIGH (ref 0.61–1.24)
GFR, Estimated: 54 mL/min — ABNORMAL LOW (ref >60)
Glucose, Bld: 202 mg/dL — ABNORMAL HIGH (ref 70–99)
Potassium: 4.3 mmol/L (ref 3.5–5.1)
Sodium: 147 mmol/L — ABNORMAL HIGH (ref 135–145)

## 2020-05-04 LAB — GLUCOSE, CAPILLARY
Glucose-Capillary: 142 mg/dL — ABNORMAL HIGH (ref 70–99)
Glucose-Capillary: 163 mg/dL — ABNORMAL HIGH (ref 70–99)
Glucose-Capillary: 170 mg/dL — ABNORMAL HIGH (ref 70–99)
Glucose-Capillary: 171 mg/dL — ABNORMAL HIGH (ref 70–99)
Glucose-Capillary: 180 mg/dL — ABNORMAL HIGH (ref 70–99)
Glucose-Capillary: 193 mg/dL — ABNORMAL HIGH (ref 70–99)
Glucose-Capillary: 202 mg/dL — ABNORMAL HIGH (ref 70–99)

## 2020-05-04 MED ORDER — INSULIN ASPART 100 UNIT/ML ~~LOC~~ SOLN
0.0000 [IU] | SUBCUTANEOUS | Status: DC
  Administered 2020-05-04 (×3): 3 [IU] via SUBCUTANEOUS
  Administered 2020-05-04: 2 [IU] via SUBCUTANEOUS
  Administered 2020-05-05 – 2020-05-06 (×6): 3 [IU] via SUBCUTANEOUS
  Administered 2020-05-06 (×4): 5 [IU] via SUBCUTANEOUS

## 2020-05-04 NOTE — Progress Notes (Signed)
Inpatient Diabetes Program Recommendations  AACE/ADA: New Consensus Statement on Inpatient Glycemic Control (2015)  Target Ranges:  Prepandial:   less than 140 mg/dL      Peak postprandial:   less than 180 mg/dL (1-2 hours)      Critically ill patients:  140 - 180 mg/dL   Lab Results  Component Value Date   GLUCAP 193 (H) 05/04/2020   HGBA1C 5.5 04/28/2020    Review of Glycemic Control Results for Terry Boyle, Terry Boyle (MRN [id number]) as of 05/04/2020 10:17  Ref. Range 05/03/2020 23:22 05/04/2020 03:18 05/04/2020 07:41  Glucose-Capillary Latest Ref Range: 70 - 99 mg/dL 188 (H) 202 (H) 193 (H)   Diabetes history: None Current orders for Inpatient glycemic control:  Novolog sensitive q 4 hours Vital 65 ml/hr Inpatient Diabetes Program Recommendations:    Please consider adding Novolog tube feed coverage 3 units q 4 hours.   Thanks,  Adah Perl, RN, BC-ADM Inpatient Diabetes Coordinator Pager [phone] (8a-5p)

## 2020-05-04 NOTE — Progress Notes (Signed)
Did not perform bed pt on patient this round due to coughing up lots of secretions uncontrollably.

## 2020-05-04 NOTE — Progress Notes (Signed)
Per Lab, Plt count now at 23, but per previous note from Dr. Silas Flood, no transfusion indicated until Hgb less than 7 and Plts less than 10. E-link notified.

## 2020-05-04 NOTE — Progress Notes (Signed)
NAME:  Terry Boyle, MRN:  [id number], DOB:  01/15/1955, LOS: 7 ADMISSION DATE:  04/12/2020, CONSULTATION DATE:  04/16/2020 REFERRING MD:  Forestine Na , CHIEF COMPLAINT:  Encephalopathy, Shock   History of Present Illness:  Mr. Terry Boyle was found down by neighbor who checks on him daily, last seen normal 04/26/20 PM.  Initially described as completely unresponsive, was hypothermic (initial temp 87.6). At 4pm hour described as making spontaneous movements, turning head, still without eye contact or blink to threat. Intubated at about 730pm for altered mental status. Episode of bloody stool after intubation at ED. Hypotension, started on levophed at around 7pm prior to intubation. Moderate volume BRBPR here on arrival in ICU. In ED received NS 3.5 L bolus total, zosyn/vanc, levophed.   CT head negative for acute.   Pertinent  Medical History  HTN, Chronic Hep B, Seizure, H/O Left Crani, Cirrhosis, H/O EGD with noted hepatic gastropathy and gastric ulcers.   Significant Hospital Events:  . 4/18: Intubated. Shock. On levophed/vasopressin. Given 2 units FFP for INR 2.1  . 4/19 IVF boluses, albumin, NE requirement imrpoved, UOP picked up, Cordis placed, vent weaned . 4/20 weaned NE, uop good, zosyn stopped . 4/21 Encephalopathic, unable to extubate . 4/22 Slight improvement in MS . 4/23 follows commands . 4/24 not following commands . 4/25 patient is tolerating pressure support trial  Interim History / Subjective:  Patient remains unresponsive, despite being off sedation for quite some time  Objective   Blood pressure 109/62, pulse 89, temperature 98.8 F (37.1 C), temperature source Axillary, resp. rate 17, height 6\' 1"  (1.854 m), weight 87.2 kg, SpO2 100 %.    Vent Mode: PSV FiO2 (%):  [40 %] 40 % Set Rate:  [14 bmp] 14 bmp Vt Set:  [560 mL] 560 mL PEEP:  [5 cmH20] 5 cmH20 Pressure Support:  [5 cmH20] 5 cmH20 Plateau Pressure:  [11 cmH20] 11 cmH20   Intake/Output Summary (Last 24 hours)  at 05/04/2020 1337 Last data filed at 05/04/2020 1200 Gross per 24 hour  Intake 3812.82 ml  Output 1700 ml  Net 2112.82 ml   Filed Weights   05/02/20 0404 05/03/20 0500 05/04/20 0452  Weight: 80 kg 80.3 kg 87.2 kg    Examination: General: Critically ill adult male, orally intubated HENT: ETT/OG in place, moist mucous membranes, no JVD Lungs: Diminished to bases, vent assisted breaths, no wheezes Cardiovascular: RRR, no MRG Abdomen: thin, soft, active bowel sounds Neuro: Eyes closed, not following commands, no movement to painful stimuli GU: foley in place   Labs/imaging that I havepersonally reviewed  (right click and "Reselect all SmartList Selections" daily)  Labs and Imaging   Resolved Hospital Problem list   Hemorrhagic Shock  Assessment & Plan:   Encephalopathy, multifactorial with uremia, shock, hypernatremia  Hypoxic/ischemic CT Head Negative for acute 4/18 EEG negative Despite being off sedation for 4 days patient remained completely unresponsive MRI brain was done 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 Keep sedation off  H/O Seizures  Plan EEG negative Renal Dose home Keppra  Acute GI Bleed Resolved - EGD was clear Liver Cirrhosis > now with acute decompensation Chronic Hep B H/O EGD 2020 with portal hypertension gastropathy and gastric ulcers, no varices noted H/O Pancreatic Insufficieny   Monitor H&H Transfuse if less than 7  Coagulopathy with shock state and liver dysfunction  Stable, closely monitor  Acute Renal Injury secondary to hypoperfusion with severe dehydration and shock state Mild Rhabdo - Improving  Kidney functions improving  Hypernatremia: High salt load with IVF resuscitation, lack of intake Serum sodium is trending down Decrease D5W, continue free water  Best practice (right click and "Reselect all SmartList Selections" daily)  Diet:  Tube Feed  Pain/Anxiety/Delirium protocol (if indicated): Yes (RASS goal  -1) VAP protocol (if indicated): Yes DVT prophylaxis: SCD GI prophylaxis: PPI Glucose control:  SSI Yes Central venous access:  N/A Arterial line:  N/A Foley:  Yes, and it is still needed Mobility:  bed rest  PT consulted: N/A Last date of multidisciplinary goals of care discussion 4/23 Code Status:  DNR Disposition: Patient sister was updated at bedside.  Stated MRI brain result is pending, will continue to discuss goals of care   Total critical care time: 44 minutes  Performed by: [place] care time was exclusive of separately billable procedures and treating other patients.   Critical care was necessary to treat or prevent imminent or life-threatening deterioration.   Critical care was time spent personally by me on the following activities: development of treatment plan with patient and/or surrogate as well as nursing, discussions with consultants, evaluation of patient's response to treatment, examination of patient, obtaining history from patient or surrogate, ordering and performing treatments and interventions, ordering and review of laboratory studies, ordering and review of radiographic studies, pulse oximetry and re-evaluation of patient's condition.   Jacky Kindle MD [facility] See Amion for pager If no response to pager, please call [phone] until 7pm After 7pm, Please call E-link [phone]

## 2020-05-04 NOTE — Progress Notes (Signed)
[facility] Gastroenterology Progress Note  Terry Boyle 65 y.o. [birth year]  CC:   Rectal bleeding  Subjective: Patient seen and examined at bedside.  Not able to obtain any history from the patient.  Discussed with staff at bedside.  No further bleeding episodes.  Brown-colored stool in the fecal collection system.  ROS : Not able to obtain   Objective: Vital signs in last 24 hours: Vitals:   05/04/20 0800 05/04/20 0900  BP: (!) 123/59 108/61  Pulse: 93 95  Resp: 16 15  Temp:    SpO2: 96% 98%    Physical Exam: General.  Intubated, sedated. Abdomen.  Soft, nontender, nondistended, bowel sounds present.  Lab Results: Recent Labs    05/03/20 0340 05/04/20 0130  NA 158* 147*  K 3.4* 4.3  CL 130* 117*  CO2 23 24  GLUCOSE 189* 202*  BUN 56* 44*  CREATININE 1.78* 1.46*  CALCIUM 7.6* 7.5*  MG 3.0*  --    Recent Labs    05/03/20 0340  AST 130*  ALT 457*  ALKPHOS 101  BILITOT 2.5*  PROT 4.7*  ALBUMIN 2.1*   Recent Labs    05/03/20 0340 05/04/20 0130  WBC 17.5* 18.6*  NEUTROABS 15.2*  --   HGB 9.0* 9.1*  HCT 26.8* 26.7*  MCV 110.3* 108.5*  PLT 29* 23*   No results for input(s): LABPROT, INR in the last 72 hours.    Assessment/Plan: -Rectal bleeding.  Most likely ischemic colitis.  Hemoglobin stable now.  Recent EGD showed small nonbleeding esophageal varices.    -Cirrhosis with history of chronic hepatitis B.  Significantly elevated LFTs could be from ischemic hepatitis.    -Acute kidney injury.  Improved  -Encephalopathy    Recommendations  -------------------------  -Continue supportive care  -Very low HBV DNA level at 690. -No further inpatient GI work-up planned.  GI will sign off.  Follow-up in GI clinic in 2 months after discharge for further management of his chronic hepatitis B and cirrhosis    Otis Brace MD, FACP 05/04/2020, 10:47 AM  Contact #  [phone]

## 2020-05-05 DIAGNOSIS — Z789 Other specified health status: Secondary | ICD-10-CM

## 2020-05-05 LAB — GLUCOSE, CAPILLARY
Glucose-Capillary: 187 mg/dL — ABNORMAL HIGH (ref 70–99)
Glucose-Capillary: 190 mg/dL — ABNORMAL HIGH (ref 70–99)
Glucose-Capillary: 167 mg/dL — ABNORMAL HIGH (ref 70–99)
Glucose-Capillary: 177 mg/dL — ABNORMAL HIGH (ref 70–99)
Glucose-Capillary: 135 mg/dL — ABNORMAL HIGH (ref 70–99)
Glucose-Capillary: 170 mg/dL — ABNORMAL HIGH (ref 70–99)

## 2020-05-05 LAB — CBC
HCT: 26.7 % — ABNORMAL LOW (ref 39.0–52.0)
Hemoglobin: 9.3 g/dL — ABNORMAL LOW (ref 13.0–17.0)
MCH: 37.1 pg — ABNORMAL HIGH (ref 26.0–34.0)
MCHC: 34.8 g/dL (ref 30.0–36.0)
MCV: 106.4 fL — ABNORMAL HIGH (ref 80.0–100.0)
Platelets: 25 10*3/uL — CL (ref 150–400)
RBC: 2.51 MIL/uL — ABNORMAL LOW (ref 4.22–5.81)
RDW: 16.5 % — ABNORMAL HIGH (ref 11.5–15.5)
WBC: 14.7 10*3/uL — ABNORMAL HIGH (ref 4.0–10.5)
nRBC: 0.4 % — ABNORMAL HIGH (ref 0.0–0.2)

## 2020-05-05 LAB — BASIC METABOLIC PANEL
Anion gap: 10 (ref 5–15)
BUN: 39 mg/dL — ABNORMAL HIGH (ref 8–23)
CO2: 24 mmol/L (ref 22–32)
Calcium: 7.8 mg/dL — ABNORMAL LOW (ref 8.9–10.3)
Chloride: 107 mmol/L (ref 98–111)
Creatinine, Ser: 1.22 mg/dL (ref 0.61–1.24)
GFR, Estimated: >60 mL/min (ref >60)
Glucose, Bld: 185 mg/dL — ABNORMAL HIGH (ref 70–99)
Potassium: 4.4 mmol/L (ref 3.5–5.1)
Sodium: 141 mmol/L (ref 135–145)

## 2020-05-05 MED ORDER — FREE WATER
200.0000 mL | Status: DC
  Administered 2020-05-05 (×3): 200 mL

## 2020-05-05 MED ORDER — FREE WATER
200.0000 mL | Status: DC
  Administered 2020-05-05 – 2020-05-06 (×9): 200 mL

## 2020-05-05 MED ORDER — FUROSEMIDE 10 MG/ML IJ SOLN
40.0000 mg | Freq: Once | INTRAMUSCULAR | Status: AC
  Administered 2020-05-05: 40 mg via INTRAVENOUS
  Filled 2020-05-05: qty 4

## 2020-05-05 NOTE — Progress Notes (Addendum)
NAME:  Terry Boyle, MRN:  [id number], DOB:  [birth year], LOS: 8 ADMISSION DATE:  04/14/2020, CONSULTATION DATE:  05/05/2020 REFERRING MD:  Forestine Na , CHIEF COMPLAINT:  Encephalopathy, Shock   History of Present Illness:  Mr. Chopin was found down by neighbor who checks on him daily, last seen normal 04/26/20 PM.  Initially described as completely unresponsive, was hypothermic (initial temp 87.6). At 4pm hour described as making spontaneous movements, turning head, still without eye contact or blink to threat. Intubated at about 730pm for altered mental status. Episode of bloody stool after intubation at ED. Hypotension, started on levophed at around 7pm prior to intubation. Moderate volume BRBPR here on arrival in ICU. In ED received NS 3.5 L bolus total, zosyn/vanc, levophed.   CT head negative for acute.   Pertinent  Medical History  HTN, Chronic Hep B, Seizure, H/O Left Crani, Cirrhosis, H/O EGD with noted hepatic gastropathy and gastric ulcers.   Significant Hospital Events:  . 4/18: Intubated. Shock. On levophed/vasopressin. Given 2 units FFP for INR 2.1  . 4/19 IVF boluses, albumin, NE requirement imrpoved, UOP picked up, Cordis placed, vent weaned . 4/20 weaned NE, uop good, zosyn stopped . 4/21 Encephalopathic, unable to extubate . 4/22 Slight improvement in MS . 4/23 follows commands . 4/24 not following commands . 4/25 patient is tolerating pressure support trial . 4/26 On full support, Na 141, + 20 L  Interim History / Subjective:  Patient remains unresponsive, despite no sedation for days.  Teeth grinding at times, nothing purposeful Na 141,WBC is 14.7, HGB 9.3, platelets 25K 2800 UO last 24, Net negative 100 cc's last 24+ 20 L since admission  Objective   Blood pressure 128/68, pulse 93, temperature 98.4 F (36.9 C), temperature source Oral, resp. rate 18, height 6\' 1"  (1.854 m), weight 84.6 kg, SpO2 95 %.    Vent Mode: PRVC FiO2 (%):  [40 %-50 %] 50 % Set Rate:  [14  bmp] 14 bmp Vt Set:  [560 mL] 560 mL PEEP:  [5 cmH20] 5 cmH20 Pressure Support:  [5 cmH20-10 cmH20] 10 cmH20 Plateau Pressure:  [11 cmH20-16 cmH20] 16 cmH20   Intake/Output Summary (Last 24 hours) at 05/05/2020 1884 Last data filed at 05/05/2020 0900 Gross per 24 hour  Intake 2620.43 ml  Output 2901 ml  Net -280.57 ml   Filed Weights   05/03/20 0500 05/04/20 0452 05/05/20 0500  Weight: 80.3 kg 87.2 kg 84.6 kg    Examination: General: Critically ill adult male, orally intubated, unresponsive HENT: ETT/OG in place, moist mucous membranes, no JVD, No LAD, Sclera jaundiced Lungs: Bilateral chest excursion, Rhonchi throughout, Diminished per bases, vent assisted breaths, no wheezing Cardiovascular: RRR, no MRG, SR per tele Abdomen: thin, soft, active bowel sounds, tolerating TF Neuro: Eyes closed, not following commands, no movement to painful stimuli,pupils 41mm NR GU: foley in place , concentrated urine  Labs/imaging that I havepersonally reviewed  (right click and "Reselect all SmartList Selections" daily)  Labs and Imaging   Resolved Hospital Problem list   Hemorrhagic Shock  Assessment & Plan:   Encephalopathy, multifactorial with uremia, shock, hypernatremia  Hypoxic/ischemic CT Head Negative for acute 4/18 EEG negative Despite being off sedation for 5 days patient remained completely unresponsive MRI brain was done 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 Keep sedation off  H/O Seizures  Plan EEG negative Renal Dose home Keppra  Acute GI Bleed Resolved - EGD was clear Liver Cirrhosis > now with acute decompensation Chronic Hep B  H/O EGD 2020 with portal hypertension gastropathy and gastric ulcers, no varices noted H/O Pancreatic Insufficieny   Monitor H&H Transfuse if less than 7 Consider trending LFT's  Coagulopathy with shock state and liver dysfunction  Thrombocytopenia Stable, closely monitor - Trend CBC - Monitor for bleeding   Acute  Renal Injury secondary to hypoperfusion with severe dehydration and shock state Mild Rhabdo - Improving  + 20 L since admission Kidney functions improving Trend BMET Monitor UO Avoid renal toxic medications Maintain renal perfususion Lasix 40 x 1 on 4/26  Hypernatremia:  High salt load with IVF resuscitation, lack of intake Serum sodium is trending down Discontinue D5W  decrease free water from Q 1 to Q 4 ( 200 cc's)  Best practice (right click and "Reselect all SmartList Selections" daily)  Diet:  Tube Feed  Pain/Anxiety/Delirium protocol (if indicated): Yes (RASS goal -1) VAP protocol (if indicated): Yes DVT prophylaxis: SCD GI prophylaxis: PPI Glucose control:  SSI Yes Central venous access:  N/A Arterial line:  N/A Foley:  Yes, and it is still needed Mobility:  bed rest  PT consulted: N/A Last date of multidisciplinary goals of care discussion 4/23 Code Status:  DNR Disposition: Patient sister was updated at bedside.  Stated MRI brain 4/25  shows extensive acute hypoxic/ischemic injury. Daughters are aware, and plan on family discussion to discuss goals of care   Total critical care time: 80 minutes  Eric Form, AGACNP   Critical care time was exclusive of separately billable procedures and treating other patients.   Critical care was necessary to treat or prevent imminent or life-threatening deterioration.   Critical care was time spent personally by me on the following activities: development of treatment plan with patient and/or surrogate as well as nursing, discussions with consultants, evaluation of patient's response to treatment, examination of patient, obtaining history from patient or surrogate, ordering and performing treatments and interventions, ordering and review of laboratory studies, ordering and review of radiographic studies, pulse oximetry and re-evaluation of patient's condition.   Magdalen Spatz, MSN, AGACNP-BC [place] for personal pager PCCM on call pager [phone] 05/05/2020 10:11 AM

## 2020-05-06 ENCOUNTER — Inpatient Hospital Stay (HOSPITAL_COMMUNITY): Payer: Medicaid Other

## 2020-05-06 DIAGNOSIS — Z789 Other specified health status: Secondary | ICD-10-CM

## 2020-05-06 DIAGNOSIS — Z7189 Other specified counseling: Secondary | ICD-10-CM

## 2020-05-06 DIAGNOSIS — Z01818 Encounter for other preprocedural examination: Secondary | ICD-10-CM

## 2020-05-06 LAB — BASIC METABOLIC PANEL
Anion gap: 14 (ref 5–15)
BUN: 47 mg/dL — ABNORMAL HIGH (ref 8–23)
CO2: 23 mmol/L (ref 22–32)
Calcium: 8.5 mg/dL — ABNORMAL LOW (ref 8.9–10.3)
Chloride: 106 mmol/L (ref 98–111)
Creatinine, Ser: 1.42 mg/dL — ABNORMAL HIGH (ref 0.61–1.24)
GFR, Estimated: 56 mL/min — ABNORMAL LOW (ref >60)
Glucose, Bld: 193 mg/dL — ABNORMAL HIGH (ref 70–99)
Potassium: 4.6 mmol/L (ref 3.5–5.1)
Sodium: 143 mmol/L (ref 135–145)

## 2020-05-06 LAB — GLUCOSE, CAPILLARY
Glucose-Capillary: 163 mg/dL — ABNORMAL HIGH (ref 70–99)
Glucose-Capillary: 192 mg/dL — ABNORMAL HIGH (ref 70–99)
Glucose-Capillary: 209 mg/dL — ABNORMAL HIGH (ref 70–99)
Glucose-Capillary: 214 mg/dL — ABNORMAL HIGH (ref 70–99)
Glucose-Capillary: 219 mg/dL — ABNORMAL HIGH (ref 70–99)
Glucose-Capillary: 222 mg/dL — ABNORMAL HIGH (ref 70–99)

## 2020-05-06 LAB — CBC
HCT: 26.4 % — ABNORMAL LOW (ref 39.0–52.0)
Hemoglobin: 9.4 g/dL — ABNORMAL LOW (ref 13.0–17.0)
MCH: 37.2 pg — ABNORMAL HIGH (ref 26.0–34.0)
MCHC: 35.6 g/dL (ref 30.0–36.0)
MCV: 104.3 fL — ABNORMAL HIGH (ref 80.0–100.0)
Platelets: 34 10*3/uL — ABNORMAL LOW (ref 150–400)
RBC: 2.53 MIL/uL — ABNORMAL LOW (ref 4.22–5.81)
RDW: 16.6 % — ABNORMAL HIGH (ref 11.5–15.5)
WBC: 11.1 10*3/uL — ABNORMAL HIGH (ref 4.0–10.5)
nRBC: 0.5 % — ABNORMAL HIGH (ref 0.0–0.2)

## 2020-05-06 MED ORDER — ETOMIDATE 2 MG/ML IV SOLN
INTRAVENOUS | Status: AC
  Administered 2020-05-06: 20 mg
  Filled 2020-05-06: qty 20

## 2020-05-06 MED ORDER — LORAZEPAM 2 MG/ML IJ SOLN
2.0000 mg | INTRAMUSCULAR | Status: DC | PRN
  Administered 2020-05-07: 2 mg via INTRAVENOUS
  Filled 2020-05-06: qty 1

## 2020-05-06 MED ORDER — MIDAZOLAM HCL 2 MG/2ML IJ SOLN
1.0000 mg | INTRAMUSCULAR | Status: DC | PRN
  Administered 2020-05-07 (×2): 2 mg via INTRAVENOUS
  Filled 2020-05-06 (×2): qty 2

## 2020-05-06 MED ORDER — FENTANYL CITRATE (PF) 100 MCG/2ML IJ SOLN
25.0000 ug | INTRAMUSCULAR | Status: DC | PRN
  Administered 2020-05-07 (×7): 50 ug via INTRAVENOUS
  Filled 2020-05-06 (×7): qty 2

## 2020-05-06 MED ORDER — MIDAZOLAM HCL 2 MG/2ML IJ SOLN
INTRAMUSCULAR | Status: AC
  Filled 2020-05-06: qty 2

## 2020-05-06 MED ORDER — KETAMINE HCL 50 MG/5ML IJ SOSY
PREFILLED_SYRINGE | INTRAMUSCULAR | Status: AC
  Filled 2020-05-06: qty 5

## 2020-05-06 MED ORDER — FENTANYL CITRATE (PF) 100 MCG/2ML IJ SOLN
INTRAMUSCULAR | Status: AC
  Administered 2020-05-06: 100 ug
  Filled 2020-05-06: qty 2

## 2020-05-06 MED ORDER — ROCURONIUM BROMIDE 10 MG/ML (PF) SYRINGE
PREFILLED_SYRINGE | INTRAVENOUS | Status: AC
  Administered 2020-05-06: 100 mg
  Filled 2020-05-06: qty 10

## 2020-05-06 MED ORDER — FENTANYL CITRATE (PF) 100 MCG/2ML IJ SOLN: 100.0000 ug | Freq: Once | INTRAMUSCULAR | Status: AC

## 2020-05-06 MED ORDER — ROCURONIUM BROMIDE 50 MG/5ML IV SOLN
100.0000 mg | Freq: Once | INTRAVENOUS | Status: AC
  Filled 2020-05-06: qty 10

## 2020-05-06 MED ORDER — ETOMIDATE 2 MG/ML IV SOLN: 20.0000 mg | Freq: Once | INTRAVENOUS | Status: AC

## 2020-05-06 MED ORDER — FENTANYL CITRATE (PF) 100 MCG/2ML IJ SOLN
INTRAMUSCULAR | Status: AC
  Administered 2020-05-06: 100 ug
  Filled 2020-05-06: qty 4

## 2020-05-06 MED ORDER — FENTANYL CITRATE (PF) 100 MCG/2ML IJ SOLN
INTRAMUSCULAR | Status: AC
  Filled 2020-05-06: qty 2

## 2020-05-06 NOTE — Progress Notes (Signed)
eLink Physician-Brief Progress Note Patient Name: Terry Boyle DOB: 1955/02/25 MRN: [id number]   Date of Service  05/06/2020  HPI/Events of Note  Camera; Cuff leak again. sats 90's. RR up. DNR. Extubation to comfort in AM, as per hand off and bed side discussion.   eICU Interventions  Bed side CCM team notified already by Lattie Haw from [place].      Intervention Category Major Interventions: Respiratory failure - evaluation and management  Elmer Sow 05/06/2020, 9:57 PM

## 2020-05-06 NOTE — Progress Notes (Signed)
Nutrition Follow-up  DOCUMENTATION CODES:   Not applicable  INTERVENTION:   Continue tube feeding via OG tube: Vital AF 1.2 at 65 ml/h (1560 ml per day).  Provides 1872 kcal, 117 gm protein, 1265 ml free water daily.  Free water flushes 200 ml every 4 hours for total 2465 ml free water daily.  NUTRITION DIAGNOSIS:   Inadequate oral intake related to inability to eat as evidenced by NPO status.  Ongoing   GOAL:   Patient will meet greater than or equal to 90% of their needs   Met with TF  MONITOR:   Vent status,TF tolerance,Labs  REASON FOR ASSESSMENT:   Ventilator,Consult Enteral/tube feeding initiation and management  ASSESSMENT:   65 yo male admitted with encephalopathy, shock, hypothermia, GI bleed, rhabdomyolysis after being found down by neighbor. PMH includes HIV, seizure disorder, smoker.   Discussed patient in ICU rounds and with RN today. Currently receiving Vital AF 1.2 at 65 ml/h, tolerating well. Free water flushes 200 ml every 4 hours. MRI 4/25 c/w anoxic brain injury. Family meetings to discuss goals of care are ongoing.  Patient is currently intubated on ventilator support MV: 12.3 L/min Temp (24hrs), Avg:98.5 F (36.9 C), Min:98.4 F (36.9 C), Max:99.1 F (37.3 C)   Labs reviewed.  CBG: [phone]  Medications reviewed.  Weight 85.2 kg Admission weight 73 kg  I/O +18.3 L  Diet Order:   Diet Order            Diet NPO time specified  Diet effective now                 EDUCATION NEEDS:   Not appropriate for education at this time  Skin:  Skin Assessment: Reviewed RN Assessment (skin tear L knee)  Last BM:  4/27 rectal tube  Height:   Ht Readings from Last 1 Encounters:  05/05/2020 6' 1" (1.854 m)    Weight:   Wt Readings from Last 1 Encounters:  05/06/20 85.2 kg    Ideal Body Weight:  83.6 kg  BMI:  Body mass index is 24.78 kg/m.  Estimated Nutritional Needs:   Kcal:  1850  Protein:  110-130  gm  Fluid:  >/= 1.8 L     H, RD, LDN, CNSC Please refer to Amion for contact information.                                                        

## 2020-05-06 NOTE — Procedures (Signed)
Intubation Procedure Note  Terry Boyle  [id number]  March 24, 1955  Date:05/06/20  Time:11:29 AM   Provider Performing:Tiffiny Worthy    Procedure: Intubation (40973)  Indication(s) Respiratory Failure  Consent Unable to obtain consent due to emergent nature of procedure.   Anesthesia Etomidate and Rocuronium   Time Out Verified patient identification, verified procedure, site/side was marked, verified correct patient position, special equipment/implants available, medications/allergies/relevant history reviewed, required imaging and test results available.   Sterile Technique Usual hand hygeine, masks, and gloves were used   Procedure Description Patient positioned in bed supine.  Sedation given as noted above.  Patient was intubated with endotracheal tube using DL.  View was Grade 1 full glottis .  Number of attempts was 1.  Colorimetric CO2 detector was consistent with tracheal placement.   Complications/Tolerance None; patient tolerated the procedure well. Chest X-ray is ordered to verify placement.   EBL Minimal   Specimen(s) None

## 2020-05-06 NOTE — Procedures (Signed)
Name: Terry Boyle MRN: [id number] DOB: 1955-03-31   PROCEDURE NOTE  Procedure:  Endotracheal intubation.  Indication:  Acute respiratory failure. Patient is so agitated he was biting and bit through the ETT and the balloon catheter was cut off completley   Consent:  Consent was implied due to the emergency nature of the procedure.  Anesthesia:  A total of 100 mcg of Fentanyl , 100 mg of vecuronium   Procedure summary:  Appropriate equipment was assembled. The patient was identified as [name] and safety timeout was performed. The patient was placed supine, with head in sniffing position. After adequate level of anesthesia was achieved, this over a bougie exchange  Colorimetric change was noted on the CO2 meter. Breath sounds were heard over both lung fields equally. ETT was secured at 25 lip line.  Post procedure chest xray was ordered.  Complications:  No immediate complications were noted.  Hemodynamic parameters and oxygenation remained stable throughout the procedure.      05/06/2020, 10:14 PM

## 2020-05-06 NOTE — Progress Notes (Signed)
During giving a bath patient started coughing and bit through the cuff inflator cord. Notified eLink, Unit Respiratory Therapist, Unit Charge Nurse. Ground team notified via [place]. Dr. Milon Dikes from groud team to bedside to replace ETT. Gave 116mcg of Fentynal and 100mg  of rocuronium via verbal order at bedside, and repeated back with approval. Sedation ordered PRN and CXR ordered for tube placement.  Jefferson Fuel, RN 05/06/20 [phone]

## 2020-05-06 NOTE — Consult Note (Signed)
Consultation Note Date: 05/06/2020   Patient Name: Terry Boyle  DOB: 02/08/1955  MRN: [id number]  Age / Sex: 65 y.o., male  PCP: Charlott Rakes, MD Referring Physician: Jacky Kindle, MD  Reason for Consultation: Establishing goals of care  HPI/Patient Profile: 65 y.o. male  with past medical history of  admitted on 05/03/2020 after being found down and unresponsive in his home. Became a little more responsive initially turning head, but no eye contact or response to visual threat. He was intubated on admission for AMS presumed to be from shock- required IV pressors, noted to have bloody stool. Initial CT scan head negative, however, MRI shows ischemic/anoxic brain injury. He is currently unresponsive, has been off of sedation for 4 days. EEG negative for seizures. Palliative medicine consulted for [place].    Clinical Assessment and Goals of Care: Evaluated patient. He was unresponsive to all stimuli.  Received report from patient's RN.  Spoke with patient's sister- Delthine who then included patient's other siblings on conference call. Patient is not married- he has one child who he is estranged from.  Family was very clear and all in agreement that patient would not want to continue to live in an artficially supported state if there was certainty that he would not return to a fully independent state of being. He values "getting up and going" and would not want to be bedbound.  Questions answered re: transition to full comfort measures only and allowing for natural dying process with symptom management, comfort and dignity.  We discussed the dying process. They discussed how their mother died several years ago, and their brother more recently in similar situations.  At close of our discussion- plan made for transition to full comfort measures only, allow for visitation. Family to arrive approximately 10am tomorrow  and I will meet with them and then they would like to proceed with extubation.   Primary Decision Maker NEXT OF KIN- patient's siblings    SUMMARY OF RECOMMENDATIONS -Plan for extubation to comfort tomorrow- will d/c labs, IV fluids, tube feeding tonight in anticipation of extubation and comfort tomorrow -Family to arrive around 10am tomorrow morning- I will meet with them again then and enter comfort and extubation orders -Full DNR order placed    Code Status/Advance Care Planning:  DNR Prognosis:    Hours - Days  Discharge Planning: Anticipated Hospital Death  Primary Diagnoses: Present on Admission: . Sepsis ([place])   I have reviewed the medical record, interviewed the patient and family, and examined the patient. The following aspects are pertinent.  Past Medical History:  Diagnosis Date  . HIV (human immunodeficiency virus infection) ([place])   . Seizure disorder [facility])    Social History   Socioeconomic History  . Marital status: Divorced    Spouse name: Not on file  . Number of children: Not on file  . Years of education: Not on file  . Highest education level: Not on file  Occupational History  . Not on file  Tobacco Use  . Smoking status:  Current Every Day Smoker    Packs/day: 1.00    Types: Cigarettes  . Smokeless tobacco: Never Used  Vaping Use  . Vaping Use: Never used  Substance and Sexual Activity  . Alcohol use: Not Currently  . Drug use: Not Currently  . Sexual activity: Not on file  Other Topics Concern  . Not on file  Social History Narrative  . Not on file   Social Determinants of Health   Financial Resource Strain: Not on file  Food Insecurity: Not on file  Transportation Needs: Not on file  Physical Activity: Not on file  Stress: Not on file  Social Connections: Not on file   Scheduled Meds: . chlorhexidine gluconate (MEDLINE KIT)  15 mL Mouth Rinse BID  . Chlorhexidine Gluconate Cloth  6 each Topical Q0600  . famotidine  20 mg Per  Tube Daily  . fentaNYL      . free water  200 mL Per Tube Q4H  . insulin aspart  0-15 Units Subcutaneous Q4H  . ketamine HCl      . levETIRAcetam  750 mg Per Tube BID  . mouth rinse  15 mL Mouth Rinse 10 times per day  . midazolam       Continuous Infusions: . feeding supplement (VITAL AF 1.2 CAL) 1,000 mL (05/06/20 0932)   PRN Meds:.docusate, polyethylene glycol Medications Prior to Admission:  Prior to Admission medications   Medication Sig Start Date End Date Taking? Authorizing Provider  albuterol (PROAIR HFA) 108 (90 Base) MCG/ACT inhaler Inhale into the lungs. 05/07/19  Yes [provider]  ibuprofen (ADVIL) 800 MG tablet Take 1 tablet by mouth every 8 (eight) hours as needed. 08/14/19  Yes [provider]  acetaminophen-codeine (TYLENOL #3) 300-30 MG tablet Take 1 tablet by mouth every 8 (eight) hours as needed for moderate pain. 03/20/19   Fulp, Cammie, MD  albuterol (VENTOLIN HFA) 108 (90 Base) MCG/ACT inhaler 2 puffs every 4-6 hours as needed 07/09/18   Fulp, Cammie, MD  amLODipine (NORVASC) 10 MG tablet Take 1 tablet (10 mg total) by mouth daily. 02/17/20   Ladell Pier, MD  folic acid (FOLVITE) 1 MG tablet Take 1 mg by mouth daily. 11/19/19   [provider]  hyoscyamine (LEVSIN) 0.125 MG tablet TAKE 1 TABLET BY MOUTH EVERY 6 HOURS AS NEEDED FOR CRAMPS 02/17/20   Ladell Pier, MD  ibuprofen (ADVIL) 600 MG tablet Take 1 tablet (600 mg total) by mouth every 8 (eight) hours as needed. 02/17/20   Ladell Pier, MD  levETIRAcetam (KEPPRA) 1000 MG tablet Take 1 tablet (1,000 mg total) by mouth 2 (two) times daily. Please make PCP appointment. Last fill! 02/17/20   Ladell Pier, MD  lipase/protease/amylase (CREON) 12000-38000 units CPEP capsule Take 1 capsule (12,000 Units total) by mouth 3 (three) times daily with meals. 02/17/20   Ladell Pier, MD  ondansetron (ZOFRAN-ODT) 4 MG disintegrating tablet DISSOLVE 1 TABLET IN MOUTH EVERY 8 HOURS AS  NEEDED FOR NAUSEA FOR VOMITING 02/17/20   Ladell Pier, MD  pantoprazole (PROTONIX) 40 MG tablet Take 1 tablet (40 mg total) by mouth daily. 02/17/20   Ladell Pier, MD  polyethylene glycol powder (GLYCOLAX/MIRALAX) 17 GM/SCOOP powder Add 1 scoop (17 gm) of powder to 8 or more ounces of liquid once daily to treat constipation 02/17/20   Ladell Pier, MD  potassium chloride (KLOR-CON) 10 MEQ tablet Take 1 tablet (10 mEq total) by mouth daily. 02/17/20  Ladell Pier, MD  tenofovir (VIREAD) 300 MG tablet Take 1 tablet (300 mg total) by mouth daily. 07/09/18   Fulp, Cammie, MD  tiZANidine (ZANAFLEX) 4 MG tablet TAKE 1 TABLET BY MOUTH AT BEDTIME AS NEEDED FOR MUSCLE SPASM 04/22/19   Fulp, Cammie, MD   No Known Allergies Review of Systems  Unable to perform ROS: Mental status change    Physical Exam Vitals and nursing note reviewed.  Neurological:     Comments: unresponsive     Vital Signs: BP 125/64   Pulse (!) 125   Temp 98.4 F (36.9 C) (Oral)   Resp 14   Ht 6' 1"  (1.854 m)   Wt 85.2 kg   SpO2 99%   BMI 24.78 kg/m  Pain Scale: CPOT   Pain Score: Asleep   SpO2: SpO2: 99 % O2 Device:SpO2: 99 % O2 Flow Rate: .   IO: Intake/output summary:   Intake/Output Summary (Last 24 hours) at 05/06/2020 1556 Last data filed at 05/06/2020 1100 Gross per 24 hour  Intake 325 ml  Output 1475 ml  Net -1150 ml    LBM: Last BM Date: 05/06/20 Baseline Weight: Weight: 73 kg Most recent weight: Weight: 85.2 kg     Palliative Assessment/Data: PPS: 10%     Thank you for this consult. Palliative medicine will continue to follow and assist as needed.   Time In: 1541 Time Out: 1704 Time Total: 83 minutes Greater than 50%  of this time was spent counseling and coordinating care related to the above assessment and plan.  Signed by: Mariana Kaufman, AGNP-C Palliative Medicine    Please contact Palliative Medicine Team phone at [phone] for questions and concerns.  For  individual provider: See Shea Evans

## 2020-05-06 NOTE — Progress Notes (Signed)
Will add Fentanyl and PRN sedation as pt is so agitated and bit through the tube twice in the last 24 hours

## 2020-05-06 NOTE — Progress Notes (Signed)
NAME:  Martice Doty, MRN:  [id number], DOB:  08-18-1955, LOS: 9 ADMISSION DATE:  04/17/2020, CONSULTATION DATE:  05/06/2020 REFERRING MD:  Forestine Na , CHIEF COMPLAINT:  Encephalopathy, Shock   History of Present Illness:  Mr. Keeter was found down by neighbor who checks on him daily, last seen normal 04/26/20 PM.  Initially described as completely unresponsive, was hypothermic (initial temp 87.6). At 4pm hour described as making spontaneous movements, turning head, still without eye contact or blink to threat. Intubated at about 730pm for altered mental status. Episode of bloody stool after intubation at ED. Hypotension, started on levophed at around 7pm prior to intubation. Moderate volume BRBPR here on arrival in ICU. In ED received NS 3.5 L bolus total, zosyn/vanc, levophed.   CT head negative for acute.   Pertinent  Medical History  HTN, Chronic Hep B, Seizure, H/O Left Crani, Cirrhosis, H/O EGD with noted hepatic gastropathy and gastric ulcers.   Significant Hospital Events:  . 4/18: Intubated. Shock. On levophed/vasopressin. Given 2 units FFP for INR 2.1  . 4/19 IVF boluses, albumin, NE requirement imrpoved, UOP picked up, Cordis placed, vent weaned . 4/20 weaned NE, uop good, zosyn stopped . 4/21 Encephalopathic, unable to extubate . 4/22 Slight improvement in MS . 4/23 follows commands . 4/24 not following commands . 4/25 patient is tolerating pressure support trial . 4/26 On full support, Na 141, + 20 L  Interim History / Subjective:  Patient remains unresponsive, despite no sedation for days.  ETT showed cuff leak despite multiple times air was failed, decision was made to change ET tube  Objective   Blood pressure 125/64, pulse (!) 125, temperature 98.4 F (36.9 C), temperature source Oral, resp. rate 14, height 6\' 1"  (1.854 m), weight 85.2 kg, SpO2 99 %.    Vent Mode: PRVC FiO2 (%):  [50 %] 50 % Set Rate:  [14 bmp] 14 bmp Vt Set:  [560 mL-640 mL] 640 mL PEEP:  [5  cmH20] 5 cmH20 Plateau Pressure:  [14 cmH20-17 cmH20] 17 cmH20   Intake/Output Summary (Last 24 hours) at 05/06/2020 1104 Last data filed at 05/06/2020 1000 Gross per 24 hour  Intake 390 ml  Output 2325 ml  Net -1935 ml   Filed Weights   05/04/20 0452 05/05/20 0500 05/06/20 0500  Weight: 87.2 kg 84.6 kg 85.2 kg    Examination: General: Critically ill adult male, orally intubated, unresponsive HENT: ETT/OG in place, moist mucous membranes, no JVD Lungs: Bilateral chest excursion, Rhonchi throughout, Diminished per bases, vent assisted breaths, no wheezing Cardiovascular: Tachycardic, regular rhythm, no MRG Abdomen: thin, soft, active bowel sounds, tolerating TF Neuro: Eyes closed, not following commands, no movement to painful stimuli,pupils 53mm NR Skin: No rash  Labs/imaging that I havepersonally reviewed  (right click and "Reselect all SmartList Selections" daily)  Labs and images were reviewed  Resolved Hospital Problem list   Hemorrhagic Shock  Assessment & Plan:   Encephalopathy, multifactorial with uremia, shock, hypernatremia  Hypoxic/ischemic CT Head Negative for acute 4/18 EEG negative Despite being off sedation for 7 days patient remained completely unresponsive MRI brain was done 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 Keep sedation off  Acute hypoxic respiratory failure Continue full support ventilation, no need of a spontaneous breathing trial considering patient has anoxic brain injury, his mental status will remain poor ET tube was exchanged due to persistent cuff leak  H/O Seizures  EEG negative Renal Dose home Keppra  Acute GI Bleed Resolved - EGD was clear  Liver Cirrhosis > now with acute decompensation Chronic Hep B Pancreatic Insufficieny   Monitor H&H Transfuse if less than 7  Coagulopathy with shock state and liver dysfunction  Thrombocytopenia Stable, closely monitor   Acute Renal Injury secondary to hypoperfusion with  severe dehydration and shock state Mild Rhabdo - Improving  + 20 L since admission Serum creatinine remain elevated to 1.4 Trend BMET Monitor UO Avoid renal toxic medications Maintain renal perfususion  Hypernatremia:  Continue free water flushes Serum sodium is trending down   Best practice (right click and "Reselect all SmartList Selections" daily)  Diet:  Tube Feed  Pain/Anxiety/Delirium protocol (if indicated): N/A VAP protocol (if indicated): Yes DVT prophylaxis: SCD GI prophylaxis: PPI Glucose control:  SSI Yes Central venous access:  N/A Arterial line:  N/A Foley: Yes, discontinue today Mobility:  bed rest  PT consulted: N/A Last date of multidisciplinary goals of care discussion 4/23 Code Status:  DNR Disposition: Patient sister was updated over the phone.  Stated MRI brain 4/25  shows extensive acute hypoxic/ischemic injury. Daughters are aware, and plan on family discussion to discuss goals of care.  Palliative care consult was requested for help and support   Total critical care time: 32 minutes  Performed by: [place] care time was exclusive of separately billable procedures and treating other patients.   Critical care was necessary to treat or prevent imminent or life-threatening deterioration.   Critical care was time spent personally by me on the following activities: development of treatment plan with patient and/or surrogate as well as nursing, discussions with consultants, evaluation of patient's response to treatment, examination of patient, obtaining history from patient or surrogate, ordering and performing treatments and interventions, ordering and review of laboratory studies, ordering and review of radiographic studies, pulse oximetry and re-evaluation of patient's condition.   Jacky Kindle MD [facility] See Amion for pager If no response to pager, please call [phone] until 7pm After 7pm, Please call E-link  [phone]

## 2020-05-07 DIAGNOSIS — Z515 Encounter for palliative care: Secondary | ICD-10-CM

## 2020-05-07 LAB — BASIC METABOLIC PANEL
Anion gap: 11 (ref 5–15)
BUN: 66 mg/dL — ABNORMAL HIGH (ref 8–23)
CO2: 24 mmol/L (ref 22–32)
Calcium: 8.4 mg/dL — ABNORMAL LOW (ref 8.9–10.3)
Chloride: 109 mmol/L (ref 98–111)
Creatinine, Ser: 1.82 mg/dL — ABNORMAL HIGH (ref 0.61–1.24)
GFR, Estimated: 41 mL/min — ABNORMAL LOW (ref >60)
Glucose, Bld: 196 mg/dL — ABNORMAL HIGH (ref 70–99)
Potassium: 4.7 mmol/L (ref 3.5–5.1)
Sodium: 144 mmol/L (ref 135–145)

## 2020-05-07 LAB — CULTURE, RESPIRATORY W GRAM STAIN

## 2020-05-07 LAB — CBC
HCT: 27.3 % — ABNORMAL LOW (ref 39.0–52.0)
Hemoglobin: 9.3 g/dL — ABNORMAL LOW (ref 13.0–17.0)
MCH: 36.2 pg — ABNORMAL HIGH (ref 26.0–34.0)
MCHC: 34.1 g/dL (ref 30.0–36.0)
MCV: 106.2 fL — ABNORMAL HIGH (ref 80.0–100.0)
Platelets: 45 10*3/uL — ABNORMAL LOW (ref 150–400)
RBC: 2.57 MIL/uL — ABNORMAL LOW (ref 4.22–5.81)
RDW: 17.5 % — ABNORMAL HIGH (ref 11.5–15.5)
WBC: 9.1 10*3/uL (ref 4.0–10.5)
nRBC: 0.9 % — ABNORMAL HIGH (ref 0.0–0.2)

## 2020-05-07 LAB — GLUCOSE, CAPILLARY
Glucose-Capillary: 140 mg/dL — ABNORMAL HIGH (ref 70–99)
Glucose-Capillary: 89 mg/dL (ref 70–99)

## 2020-05-07 MED ORDER — DIPHENHYDRAMINE HCL 50 MG/ML IJ SOLN: 25.0000 mg | INTRAMUSCULAR | Status: DC | PRN

## 2020-05-07 MED ORDER — CHLORHEXIDINE GLUCONATE 0.12 % MT SOLN
OROMUCOSAL | Status: AC
  Filled 2020-05-07: qty 15

## 2020-05-07 MED ORDER — GLYCOPYRROLATE 0.2 MG/ML IJ SOLN: 0.2000 mg | INTRAMUSCULAR | Status: DC | PRN

## 2020-05-07 MED ORDER — GLYCOPYRROLATE 1 MG PO TABS: 1.0000 mg | ORAL_TABLET | ORAL | Status: DC | PRN

## 2020-05-07 MED ORDER — HYDROMORPHONE BOLUS VIA INFUSION
1.0000 mg | INTRAVENOUS | Status: DC
  Administered 2020-05-07 (×2): 1 mg via INTRAVENOUS
  Filled 2020-05-07 (×28): qty 1

## 2020-05-07 MED ORDER — HYDROMORPHONE HCL PF 10 MG/ML IJ SOLN
0.0000 mg/h | INTRAMUSCULAR | Status: DC
  Administered 2020-05-07: 1 mg/h via INTRAVENOUS
  Filled 2020-05-07 (×2): qty 2.5

## 2020-05-07 MED ORDER — HYDROMORPHONE BOLUS VIA INFUSION: 0.5000 mg | INTRAVENOUS | Status: DC

## 2020-05-07 MED ORDER — HYDROMORPHONE BOLUS VIA INFUSION
0.5000 mg | INTRAVENOUS | Status: DC | PRN
  Administered 2020-05-07: 0.5 mg via INTRAVENOUS
  Filled 2020-05-07: qty 1

## 2020-05-07 MED ORDER — HYDROMORPHONE BOLUS VIA INFUSION
1.0000 mg | INTRAVENOUS | Status: DC | PRN
  Filled 2020-05-07: qty 1

## 2020-05-07 MED ORDER — POLYVINYL ALCOHOL 1.4 % OP SOLN
1.0000 [drp] | Freq: Four times a day (QID) | OPHTHALMIC | Status: DC | PRN
  Filled 2020-05-07: qty 15

## 2020-05-10 NOTE — Progress Notes (Signed)
Patient agitated CPT withheld at this time.  VS stable, BS clear/diminished.

## 2020-05-10 NOTE — Progress Notes (Deleted)
Increased work of breathing, not alert, minimal cough response to suctioning 

## 2020-05-10 NOTE — Progress Notes (Signed)
NAME:  Terry Boyle, MRN:  [id number], DOB:  03-13-1955, LOS: 10 ADMISSION DATE:  05-22-2020, CONSULTATION DATE:  May 22, 2020 REFERRING MD:  Forestine Na , CHIEF COMPLAINT:  Encephalopathy, Shock   History of Present Illness:  Mr. Terry Boyle was found down by neighbor who checks on him daily, last seen normal 04/26/20 PM.  Initially described as completely unresponsive, was hypothermic (initial temp 87.6). At 4pm hour described as making spontaneous movements, turning head, still without eye contact or blink to threat. Intubated at about 730pm for altered mental status. Episode of bloody stool after intubation at ED. Hypotension, started on levophed at around 7pm prior to intubation. Moderate volume BRBPR here on arrival in ICU. In ED received NS 3.5 L bolus total, zosyn/vanc, levophed.   CT head negative for acute.   Pertinent  Medical History  HTN, Chronic Hep B, Seizure, H/O Left Crani, Cirrhosis, H/O EGD with noted hepatic gastropathy and gastric ulcers.   Significant Hospital Events:  . 4/18: Intubated. Shock. On levophed/vasopressin. Given 2 units FFP for INR 2.1  . 4/19 IVF boluses, albumin, NE requirement imrpoved, UOP picked up, Cordis placed, vent weaned . 4/20 weaned NE, uop good, zosyn stopped . 4/21 Encephalopathic, unable to extubate . 4/22 Slight improvement in MS . 4/23 follows commands . 4/24 not following commands . 4/25 patient is tolerating pressure support trial . 4/26 On full support, Na 141, + 20 L  Interim History / Subjective:  ETT exhanged due to blown cuff.   Objective   Blood pressure 113/65, pulse (!) 113, temperature 97.7 F (36.5 C), temperature source Axillary, resp. rate (!) 38, height 6\' 1"  (1.854 m), weight 82.5 kg, SpO2 97 %.    Vent Mode: PRVC FiO2 (%):  [50 %-100 %] 90 % Set Rate:  [14 bmp] 14 bmp Vt Set:  [640 mL] 640 mL PEEP:  [5 cmH20] 5 cmH20 Plateau Pressure:  [14 cmH20-29 cmH20] 20 cmH20   Intake/Output Summary (Last 24 hours) at 05/08/2020  0859 Last data filed at 05/09/2020 0300 Gross per 24 hour  Intake 520 ml  Output 575 ml  Net -55 ml   Filed Weights   05/05/20 0500 05/06/20 0500 04/22/2020 0500  Weight: 84.6 kg 85.2 kg 82.5 kg    Examination: General: Critically ill adult male on ventilator  HENT: ETT/OG in place  Lungs: Coarse breath sounds, vent assisted breaths  Cardiovascular: Tachycardic, regular rhythm, no MRG Abdomen: thin, soft, non-distended  Neuro: Obtunded, does not open eyes, pupils 3 mm and sluggish  Skin: warm, dry   Labs/imaging that I havepersonally reviewed  (right click and "Reselect all SmartList Selections" daily)  Labs and images were reviewed  Resolved Hospital Problem list   Hemorrhagic Shock  Assessment & Plan:   Encephalopathy, multifactorial with uremia, shock, hypernatremia  Hypoxic/ischemic CT Head Negative for acute 4/18 Despite being off sedation for 7 days patient remained completely unresponsive MRI brain was done 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 H/O Seizures  EEG negative Plan -Transition to comfort care when family arrives  -Continue home [place] for now  -Add Dilaudid gtt for comfort as patient has required multiple PRN pushes of fentanyl overnight without effect   Acute hypoxic respiratory failure -CXR with bibasilar consolidations and possible small right pleural effusion  Plan -Vent Support -Trend CXR and ABG   Acute GI Bleed Resolved - EGD was clear Liver Cirrhosis > now with acute decompensation Chronic Hep B Pancreatic Insufficieny   Plan -Trend CBC -NPO   Coagulopathy  with shock state and liver dysfunction  Thrombocytopenia Plan -Trend CBC   Acute Renal Injury secondary to hypoperfusion with severe dehydration and shock state Mild Rhabdo - Improving  + 20 L since admission Hypernatremia Plan -Trend BMP  -Avoid renal toxic medications -Maintain renal perfususion  HIV >> Removed this as a diagnosis  Plan -Send By ID on  03/2019 noted that there is no information that validates patient has HIV. HIV antibody 03/2019 negative.   Best practice (right click and "Reselect all SmartList Selections" daily)  Diet:  NPO Pain/Anxiety/Delirium protocol (if indicated): N/A VAP protocol (if indicated): Yes DVT prophylaxis: SCD GI prophylaxis: PPI Glucose control:  SSI Yes Mobility:  bed rest  PT consulted: N/A Last date of multidisciplinary goals of care discussion 4/27 Code Status:  DNR Disposition: Palliative Care Following. Plans for terminal extubation this AM    Total critical care time: 32 minutes  Performed by: K.Marvetta Vohs   Critical care time was exclusive of separately billable procedures and treating other patients.   Critical care was necessary to treat or prevent imminent or life-threatening deterioration.   Critical care was time spent personally by me on the following activities: development of treatment plan with patient and/or surrogate as well as nursing, discussions with consultants, evaluation of patient's response to treatment, examination of patient, obtaining history from patient or surrogate, ordering and performing treatments and interventions, ordering and review of laboratory studies, ordering and review of radiographic studies, pulse oximetry and re-evaluation of patient's condition.   Hayden Pedro, AGACNP-BC [facility]  Pgr: [phone]  PCCM Pgr: [phone]

## 2020-05-10 NOTE — Progress Notes (Signed)
This chaplain responded to the RN-Autumn page for Palliative F/U spiritual care from the Pt. Family.    The chaplain honored Corey Harold previous presence and offered reflective listening as the family expressed their faith and peace in the space.  The family accepted continued intercessory prayer and the space to pause before they leave the hospital.

## 2020-05-10 NOTE — Progress Notes (Signed)
   04/30/2020 1130  Clinical Encounter Type  Visited With Patient and family together  Visit Type Death  Referral From Nurse  Consult/Referral To Chaplain  Chaplain responded to page on [place] behalf.  The patient's sister and friend were at his bedside. They were playing music and holding his hand until he passed away. I provided comfort and support for his sister. Prayer was offered. I provided the ministry of presence which allowed them to share stories of his humor and love for people.  I escorted them to the waiting area while the nurse extubated the patient. I advised them Harrie Foreman will follow up. The family expressed thankfulness of this chaplain's visit. This note was prepared by Jeanine Luz, M.Div..  For questions please contact by phone [phone].

## 2020-05-10 NOTE — Progress Notes (Signed)
Patient died on the ventilator at 23 am. Family was present at the bedside.

## 2020-05-10 NOTE — Progress Notes (Signed)
Daily Progress Note   Patient Name: Terry Boyle       Date: 2020/05/12 DOB: [birth year]  Age: 65 y.o. MRN#: [id number] Attending Physician: Jacky Kindle, MD Primary Care Physician: Charlott Rakes, MD Admit Date: 04/26/2020  Reason for Consultation/Follow-up: Establishing goals of care  Subjective: Patient with decreasing O2 and increasing RR on full vent support. Appears uncomfortable, otherwise remains unresponsive. Dilaudid drip started by CCM.  Called [place] and updated her- she agrees with aggressive comfort measures and plan for extubation when she arrives.   Review of Systems  Unable to perform ROS: Mental status change    Length of Stay: 10  Current Medications: Scheduled Meds:  . chlorhexidine gluconate (MEDLINE KIT)  15 mL Mouth Rinse BID  . Chlorhexidine Gluconate Cloth  6 each Topical Q0600  . HYDROmorphone  1 mg Intravenous Q10 min  . levETIRAcetam  750 mg Per Tube BID  . mouth rinse  15 mL Mouth Rinse 10 times per day    Continuous Infusions: . HYDROmorphone 1 mg/hr (05/12/2020 0950)    PRN Meds: diphenhydrAMINE, glycopyrrolate **OR** glycopyrrolate **OR** glycopyrrolate, LORazepam, midazolam, polyvinyl alcohol  Physical Exam Constitutional:      Appearance: He is ill-appearing and toxic-appearing.  Cardiovascular:     Comments: Bilateral upper extremity swelling Pulmonary:     Comments: tachypneic on ventilator Neurological:     Comments: unresponsive             Vital Signs: BP 113/65 (BP Location: Left Arm)   Pulse (!) 113   Temp 97.7 F (36.5 C) (Axillary)   Resp (!) 38   Ht _0  (1.854 m)   Wt 82.5 kg   SpO2 97%   BMI 24.00 kg/m  SpO2: SpO2: 97 % O2 Device: O2 Device: Ventilator O2 Flow Rate:    Intake/output summary:   Intake/Output  Summary (Last 24 hours) at 12-May-2020 1037 Last data filed at 2020-05-12 0300 Gross per 24 hour  Intake 390 ml  Output 575 ml  Net -185 ml   LBM: Last BM Date: 05/06/20 Baseline Weight: Weight: 73 kg Most recent weight: Weight: 82.5 kg       Palliative Assessment/Data: PPS: 10%     Patient Active Problem List   Diagnosis Date Noted  . Encounter for intubation   . Intubation of airway performed without  difficulty   . Sepsis ([place]) 04/10/2020  . History of GI bleed 02/17/2020  . Essential hypertension 08/15/2019  . Pancreatic disease 06/27/2019  . Acute pancreatitis 06/26/2019  . Other cirrhosis of liver ([place]) 06/26/2019  . Polysubstance (excluding opioids) dependence ([place]) 06/26/2019  . Acute blood loss anemia 06/22/2018  . 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 Marijuana use 09/01/2017  . Bilateral inguinal hernia without obstruction or gangrene 09/01/2017  . Chronic hepatitis B virus infection ([place]) 07/26/2017  . Right elbow pain 07/26/2017  . Pancytopenia ([place]) 01/12/2017  . Seizures ([place]) 01/09/2017  . Personal history of tobacco use 01/09/2017  . History of traumatic brain injury 01/11/1980    Palliative Care Assessment & Plan   Patient Profile:  65 y.o. male  with past medical history of  admitted on 04/20/2020 after being found down and unresponsive in his home. Became a little more responsive initially turning head, but no eye contact or response to visual threat. He was intubated on admission for AMS presumed to be from shock- required IV pressors, noted to have bloody stool. Initial CT scan head negative, however, MRI shows ischemic/anoxic brain injury. He is currently unresponsive, has been off of sedation for 4 days. EEG negative for seizures. Palliative medicine consulted for [place].    Assessment/Recommendations/Plan  Appreciate CCM starting comfort medications- I made a few adjustments to increase comfort Family is in en route and plan for extubation on their  arrival   Goals of Care and Additional Recommendations: Limitations on Scope of Treatment: Full Comfort Care  Code Status: DNR  Prognosis:  Hours - Days  Discharge Planning: Anticipated Hospital Death  Care plan was discussed with patient's nurse  Addendum- returned to bedside when patient's sister arrived- discussed that patient was rapidly deteriorating, BP decreasing- however, appears much more comfortable than earlier this morning. She will let RN know when ready for extubation.  Thank you for allowing the Palliative Medicine Team to assist in the care of this patient.   Total time: 63 minutes  Greater than 50%  of this time was spent counseling and coordinating care related to the above assessment and plan.  Mariana Kaufman, AGNP-C Palliative Medicine   Please contact Palliative Medicine Team phone at [phone] for questions and concerns.

## 2020-05-10 NOTE — Death Summary Note (Signed)
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 year old male who  was found down by neighbor who checks on him daily, last seen normal 04/26/20 PM. Initially described as completely unresponsive, was hypothermic (initial temp 87.6). At 4pm hour described as making spontaneous movements, turning head, still without eye contact or blink to threat. Intubated at about 730pm for altered mental status. Episode of bloody stool after intubation at ED. Hypotension, started on levophed at around 7pm prior to intubation. Moderate volume BRBPR here on arrival in ICU. In ED received NS 3.5 L bolus total, zosyn/vanc, levophed. CT head negative for acute.  Patient was admitted to ICU remain on mechanical ventilator, despite being off sedation he was not waking up, his kidney function is started improving, liver function is stabilized, his H&H remained stable.  MRI of brain was done which shows anoxic brain injury, palliative care consult was requested and had meeting with patient's family regarding goals of care  discussion, considering anoxic brain injury patient's family decided to proceed with comfort care.  Patient was palliatively extubated on Jun 04, 2020 and he was declared dead at 11:35 AM   Pertinent Labs and Studies  Significant Diagnostic Studies DG Abd 1 View  Result Date: 04/28/2020 CLINICAL DATA:  OG tube placement EXAM: ABDOMEN - 1 VIEW COMPARISON:  04/28/2020 FINDINGS: OG tube tip is in the fundus of the stomach, similar to prior study. Nonobstructive bowel gas pattern. IMPRESSION: OG tube tip in the fundus of the stomach. Electronically Signed   By: Rolm Baptise M.D.   On: 04/27/2020 17:40   CT Head Wo Contrast  Result Date: 2020/05/25 CLINICAL DATA:  Mental status change. Unknown cause. Patient found unresponsive. EXAM: CT HEAD WITHOUT CONTRAST TECHNIQUE: Contiguous axial images were obtained from the base of the skull through the vertex without intravenous contrast. COMPARISON:  None. FINDINGS: Brain: No evidence of acute infarction, hemorrhage, hydrocephalus, extra-axial collection or mass lesion/mass effect. Left temporoparietal lobe encephalomalacia. Vascular: No hyperdense vessel or unexpected calcification. Skull: Prior left parietal craniectomy. No acute osseous abnormality. Sinuses/Orbits: Visualized paranasal sinuses are clear. Visualized mastoid sinuses are clear. Visualized orbits demonstrate no focal abnormality. Other: None IMPRESSION: 1. No acute intracranial pathology. 2. Left temporoparietal lobe encephalomalacia. Electronically Signed   By: Kathreen Devoid   On: 2020-05-25 12:28   MR BRAIN WO CONTRAST  Result Date: 05/04/2020 CLINICAL DATA:  Provided history: Mental status change, alcohol/drug use. Additional history obtained from previous radiology records: Patient found unresponsive. EXAM: MRI HEAD WITHOUT CONTRAST TECHNIQUE: Multiplanar, multiecho pulse sequences of the brain and surrounding structures were obtained without intravenous contrast. COMPARISON:  Head CT 05-25-20.  FINDINGS: Brain: Mild cerebellar atrophy. There is fairly extensive cortically based restricted  diffusion and T2/FLAIR hyperintense signal abnormality within the right greater than left cerebral hemispheres. Subtle diffusion weighted signal abnormality is also questioned throughout the cerebellum bilaterally. Given the patient's history, these findings likely reflect sequela of hypoxic/ischemic injury. Chronic encephalomalacia within the left frontoparietal operculum and left temporal lobe. Additional small foci of chronic encephalomalacia within the bilat anterior frontal lobes. Moderate multifocal T2/FLAIR hyperintensity within the cerebral white matter, nonspecific but compatible with chronic small vessel ischemic disease. Chronic microhemorrhages within the left parietal and temporal lobes. No evidence of intracranial mass. No extra-axial fluid collection. No midline shift. Vascular: Expected proximal arterial flow voids. Skull and upper cervical spine: No focal marrow lesion Sinuses/Orbits: Visualized orbits show no acute finding. Mild paranasal sinus mucosal thickening at the imaged levels, most notably right maxillary. Other: Bilateral middle ear/mastoid effusions (greater on the right). IMPRESSION: Fairly extensive cortically based restricted diffusion and T2/FLAIR hyperintense signal abnormality within the right greater than left cerebral hemispheres. Subtle diffusion weighted signal abnormality is also questioned throughout the cerebellum. Given the patient's history, these findings likely reflect acute hypoxic/ischemic injury. Chronic encephalomalacia within the left frontoparietal operculum and left temporal lobe. Additional small foci of chronic encephalomalacia within the bilateral anterior frontal lobes. Moderate cerebral white matter chronic small vessel ischemic disease. Mild cerebellar atrophy. Paranasal sinus disease as described. Bilateral middle ear/mastoid effusions Electronically Signed   By:  Kellie Simmering DO   On: 05/04/2020 13:15   DG CHEST PORT 1 VIEW  Result Date: 05/06/2020 CLINICAL DATA:  Re-intubation, respiratory failure EXAM: PORTABLE CHEST 1 VIEW COMPARISON:  05/06/2020 FINDINGS: Endotracheal tube seen 3 cm above the carina. Nasogastric tube has been removed. Bibasilar consolidation, right greater than left, is unchanged. Possible small right pleural effusion is difficult to exclude. No pneumothorax. Cardiac size within normal limits. Pulmonary vascularity is normal. IMPRESSION: Endotracheal tube in appropriate position. Interval removal of nasogastric tube. Stable bibasilar consolidation. Possible small right pleural effusion. Electronically Signed   By: Fidela Salisbury MD   On: 05/06/2020 23:02   DG CHEST PORT 1 VIEW  Result Date: 05/06/2020 CLINICAL DATA:  Re-intubation EXAM: PORTABLE CHEST 1 VIEW COMPARISON:  04/28/2020 FINDINGS: Endotracheal tube tip 7 cm above the carina. Nasogastric tube enters the stomach. Bilateral lower lobe pneumonia with consolidation and volume loss. Bilateral pleural effusions. Lung apices are clear. IMPRESSION: Endotracheal tube tip 7 cm above the carina. Nasogastric tube enters the stomach. Development of extensive bilateral lower lobe pneumonia with volume loss and effusions. Electronically Signed   By: Nelson Chimes M.D.   On: 05/06/2020 11:38   DG CHEST PORT 1 VIEW  Result Date: 04/28/2020 CLINICAL DATA:  Underlying placement. EXAM: PORTABLE CHEST 1 VIEW COMPARISON:  One-view chest x-ray 05/05/2020 FINDINGS: The heart size is normal. Endotracheal tube is stable. New left IJ sheath is in place. The tip is in the innominate vein. Mild pulmonary vascular congestion is present. No edema or effusion is present. No focal airspace is present. Lung volumes are low. IMPRESSION: 1. New left IJ sheath with the tip in the innominate vein. 2. Stable endotracheal tube. 3. Mild pulmonary vascular congestion. Electronically Signed   By: San Morelle M.D.   On:  04/28/2020 09:02   DG Chest Port 1 View  Result Date: 04/20/2020 CLINICAL DATA:  66 year old male with history of altered mental status. Unresponsive patient. EXAM: PORTABLE CHEST 1 VIEW COMPARISON:  Chest x-ray 06/25/2019. FINDINGS: Lung volumes are low. No consolidative airspace disease. No pleural effusions. No pneumothorax. No pulmonary nodule or mass  noted. Pulmonary vasculature and the cardiomediastinal silhouette are within normal limits. Atherosclerosis in the thoracic aorta. IMPRESSION: 1. Low lung volumes without radiographic evidence of acute cardiopulmonary disease. 2. Aortic atherosclerosis. Electronically Signed   By: Vinnie Langton M.D.   On: 04/24/2020 11:36   DG Chest Port 1V same Day  Result Date: 04/15/2020 CLINICAL DATA:  Intubation EXAM: PORTABLE CHEST 1 VIEW COMPARISON:  04/25/2020 FINDINGS: Endotracheal tube tip is just below the clavicular heads. No focal airspace consolidation or pulmonary edema. Cardiomediastinal contours are normal. IMPRESSION: Endotracheal tube tip just below the clavicular heads. Electronically Signed   By: Ulyses Jarred M.D.   On: 04/29/2020 19:59   DG Abd Portable 1V  Result Date: 04/28/2020 CLINICAL DATA:  OG tube placement. EXAM: PORTABLE ABDOMEN - 1 VIEW COMPARISON:  CT abdomen and pelvis 06/25/2019 FINDINGS: An enteric tube terminates in the left upper quadrant with tip in the expected region of the gastric body and side hole also below the diaphragm. No dilated loops of bowel are seen to suggest obstruction. There is moderate lumbar levoscoliosis with advanced right-sided disc degeneration at L2-3. The lung bases were better evaluated on today's chest radiograph. IMPRESSION: Enteric tube placement as above. Electronically Signed   By: Logan Bores M.D.   On: 04/28/2020 10:18   EEG adult  Result Date: 04/28/2020 Lora Havens, MD     04/28/2020  4:36 PM Patient Name: Terry Boyle MRN: [id number] Epilepsy Attending: Lora Havens Referring  Physician/Provider: Hayden Pedro, NP Date: 04/28/2020 Duration: 35.27 mins Patient history: 65yo M with ams. EEG to evaluate for seizure Level of alertness:  comatose AEDs during EEG study: LEV, versed Technical aspects: This EEG study was done with scalp electrodes positioned according to the 10-20 International system of electrode placement. Electrical activity was acquired at a sampling rate of 500Hz  and reviewed with a high frequency filter of 70Hz  and a low frequency filter of 1Hz . EEG data were recorded continuously and digitally stored. Description: EEG showed continuous generalized low amplitude 3 to 6 Hz theta-delta slowing. Physiologic photic driving was not seen during photic stimulation.  Hyperventilation was not performed.   ABNORMALITY - Continuous slow, generalized IMPRESSION: This study is suggestive of severe diffuse encephalopathy, nonspecific etiology. No seizures or epileptiform discharges were seen throughout the recording. Lora Havens   ECHOCARDIOGRAM COMPLETE  Result Date: 04/28/2020    ECHOCARDIOGRAM REPORT   Patient Name:   Terry Boyle Date of Exam: 04/28/2020 Medical Rec #:  [id number]    Height:       73.0 in Accession #:    [id number]   Weight:       160.5 lb Date of Birth:  1955-08-11   BSA:          1.960 m Patient Age:    55 years     BP:           96/55 mmHg Patient Gender: M            HR:           85 bpm. Exam Location:  Inpatient Procedure: 2D Echo, Cardiac Doppler and Color Doppler Indications:    I50.40* Unspecified combined systolic (congestive) and diastolic                 (congestive) heart failure  History:        Patient has no prior history of Echocardiogram examinations.  Signs/Symptoms:Bacteremia; Risk Factors:Hypertension and Current                 Smoker. Marijuana use.  Sonographer:    Roseanna Rainbow RDCS Referring Phys: [id number] [facility]  Sonographer Comments: Technically difficult study due to poor echo windows and echo performed with  patient supine and on artificial respirator. Image acquisition challenging due to respiratory motion. Patient supine, could not turn. IMPRESSIONS  1. Left ventricular ejection fraction, by estimation, is >75%. The left ventricle has hyperdynamic function. The left ventricle has no regional wall motion abnormalities. Left ventricular diastolic parameters were normal.  2. Right ventricular systolic function is hyperdynamic. The right ventricular size is normal.  3. The mitral valve is normal in structure. Mild mitral valve regurgitation. No evidence of mitral stenosis.  4. The aortic valve is normal in structure. Aortic valve regurgitation is not visualized. No aortic stenosis is present.  5. The inferior vena cava is normal in size with greater than 50% respiratory variability, suggesting right atrial pressure of 3 mmHg. Conclusion(s)/Recommendation(s): No evidence of valvular vegetations on this transthoracic echocardiogram. Would recommend a transesophageal echocardiogram to exclude infective endocarditis if clinically indicated. FINDINGS  Left Ventricle: Left ventricular ejection fraction, by estimation, is >75%. The left ventricle has hyperdynamic function. The left ventricle has no regional wall motion abnormalities. The left ventricular internal cavity size was normal in size. There is no left ventricular hypertrophy. Left ventricular diastolic parameters were normal. Normal left ventricular filling pressure. Right Ventricle: The right ventricular size is normal. No increase in right ventricular wall thickness. Right ventricular systolic function is hyperdynamic. Left Atrium: Left atrial size was normal in size. Right Atrium: Right atrial size was normal in size. Pericardium: There is no evidence of pericardial effusion. Mitral Valve: The mitral valve is normal in structure. Mild mitral valve regurgitation. No evidence of mitral valve stenosis. Tricuspid Valve: The tricuspid valve is normal in structure.  Tricuspid valve regurgitation is not demonstrated. No evidence of tricuspid stenosis. Aortic Valve: The aortic valve is normal in structure. Aortic valve regurgitation is not visualized. No aortic stenosis is present. Pulmonic Valve: The pulmonic valve was normal in structure. Pulmonic valve regurgitation is not visualized. No evidence of pulmonic stenosis. Aorta: The aortic root is normal in size and structure. Venous: The inferior vena cava is normal in size with greater than 50% respiratory variability, suggesting right atrial pressure of 3 mmHg. IAS/Shunts: No atrial level shunt detected by color flow Doppler.  LEFT VENTRICLE PLAX 2D LVIDd:         4.90 cm     Diastology LVIDs:         3.00 cm     LV e' medial:    10.10 cm/s LV PW:         1.20 cm     LV E/e' medial:  7.2 LV IVS:        1.30 cm     LV e' lateral:   8.70 cm/s LVOT diam:     2.10 cm     LV E/e' lateral: 8.4 LV SV:         121 LV SV Index:   62 LVOT Area:     3.46 cm  LV Volumes (MOD) LV vol d, MOD A2C: 52.5 ml LV vol d, MOD A4C: 59.7 ml LV vol s, MOD A2C: 18.2 ml LV vol s, MOD A4C: 9.5 ml LV SV MOD A2C:     34.3 ml LV SV MOD A4C:  59.7 ml LV SV MOD BP:      42.4 ml RIGHT VENTRICLE             IVC RV S prime:     24.40 cm/s  IVC diam: 2.10 cm TAPSE (M-mode): 2.4 cm LEFT ATRIUM             Index      RIGHT ATRIUM          Index LA diam:        3.00 cm 1.53 cm/m RA Area:     9.91 cm LA Vol (A2C):   18.0 ml 9.19 ml/m RA Volume:   19.30 ml 9.85 ml/m LA Vol (A4C):   16.0 ml 8.17 ml/m LA Biplane Vol: 18.1 ml 9.24 ml/m  AORTIC VALVE LVOT Vmax:   177.00 cm/s LVOT Vmean:  133.000 cm/s LVOT VTI:    0.348 m  AORTA Ao Root diam: 2.80 cm MITRAL VALVE MV Area (PHT): 4.49 cm    SHUNTS MV Decel Time: 169 msec    Systemic VTI:  0.35 m MV E velocity: 73.07 cm/s  Systemic Diam: 2.10 cm MV A velocity: 81.50 cm/s MV E/A ratio:  0.90 Mihai Croitoru MD Electronically signed by Sanda Klein MD Signature Date/Time: 04/28/2020/10:16:03 AM    Final    CT CHEST  ABDOMEN PELVIS WO CONTRAST  Result Date: 04/28/2020 CLINICAL DATA:  Sepsis.  Unresponsive. EXAM: CT CHEST, ABDOMEN AND PELVIS WITHOUT CONTRAST TECHNIQUE: Multidetector CT imaging of the chest, abdomen and pelvis was performed following the standard protocol without IV contrast. COMPARISON:  Abdomen and pelvis CT 06/25/2019 FINDINGS: CT CHEST FINDINGS Cardiovascular: The heart size is normal. No substantial pericardial effusion. Atherosclerotic calcification is noted in the wall of the thoracic aorta. Left IJ central line tip is in the left innominate vein. Mediastinum/Nodes: No mediastinal lymphadenopathy. No evidence for gross hilar lymphadenopathy although assessment is limited by the lack of intravenous contrast on today's study. Endotracheal tube evident. NG tube passes into the stomach. There is no axillary lymphadenopathy. Lungs/Pleura: 5 mm right lower lobe nodule identified on image 123/6. Minimal dependent atelectasis is noted in the lower lobes bilaterally. No substantial pleural effusion. Musculoskeletal: No worrisome lytic or sclerotic osseous abnormality. CT ABDOMEN PELVIS FINDINGS Hepatobiliary: Nodular hepatic contour compatible with cirrhosis. Gallbladder is distended with layering sludge. No intrahepatic or extrahepatic biliary dilation. Pancreas: No focal mass lesion. No dilatation of the main duct. No intraparenchymal cyst. No peripancreatic edema. Spleen: No splenomegaly. No focal mass lesion. Adrenals/Urinary Tract: 2.6 cm left adrenal nodule is similar to prior and characterized as adenoma on MRI of 10/30/2019. Large right renal cyst again noted measuring 7.8 cm, stable since 06/25/2019 as is the adjacent tiny exophytic 2.3 cm lower pole cyst. Small cyst posterior left kidney is similar. Other tiny low-density lesion seen in the kidneys previously are not evident today given lack of intravenous contrast. No evidence for hydroureter. Foley catheter decompresses the urinary bladder. Air in the  bladder lumen is compatible with the instrumentation. Stomach/Bowel: NG tube tip is in the stomach. Duodenum is normally positioned as is the ligament of Treitz. No small bowel wall thickening. No small bowel dilatation. The terminal ileum is normal. The appendix is not well visualized, but there is no edema or inflammation in the region of the cecum. Circumferential wall thickening and edema seen in the right and transverse colon and in a segment of the distal transverse colon. Vascular/Lymphatic: There is abdominal aortic atherosclerosis without aneurysm. There is no gastrohepatic  or hepatoduodenal ligament lymphadenopathy. No retroperitoneal or mesenteric lymphadenopathy. No pelvic sidewall lymphadenopathy. Reproductive: The prostate gland and seminal vesicles are unremarkable. Other: No substantial intraperitoneal free fluid. Musculoskeletal: Status post right hip replacement thoracolumbar scoliosis evident. No worrisome lytic or sclerotic osseous abnormality. IMPRESSION: 1. Circumferential wall thickening and edema in the right colon and splenic flexure as well as a segment of the distal transverse colon. Imaging features are compatible with infectious/inflammatory colitis. 2. 5 mm right lower lobe pulmonary nodule new since 06/25/2019. No follow-up needed if patient is low-risk. Non-contrast chest CT can be considered in 12 months if patient is high-risk. This recommendation follows the consensus statement: Guidelines for Management of Incidental Pulmonary Nodules Detected on CT Images: From the Fleischner Society 2017; Radiology 2017; 284:228-243. 3. Cirrhotic liver morphology. 4. Stable left adrenal adenoma. 5. Bilateral renal cysts, similar to prior. 6. Aortic Atherosclerosis (ICD10-I70.0). Electronically Signed   By: Misty Stanley M.D.   On: 04/28/2020 18:29   US Abdomen Limited RUQ (LIVER/GB)  Result Date: 04/28/2020 CLINICAL DATA:  Bilirubinemia, sepsis EXAM: ULTRASOUND ABDOMEN LIMITED RIGHT UPPER  QUADRANT COMPARISON:  06/26/2019 FINDINGS: Gallbladder: Sludge layering within the gallbladder. No visible stones or wall thickening. Common bile duct: Diameter: Normal caliber, 2 mm Liver: Coarsened echotexture and nodular contours compatible with cirrhosis. No focal hepatic abnormality. Portal vein difficult to visualize, with apparent reversal of flow. Other: Large cyst in the right kidney measures 9 cm. IMPRESSION: Changes of cirrhosis and portal venous hypertension. Portal vein is difficult to visualize with probable reversal of flow in the portal vein versus thrombosis. Sludge within the gallbladder. No cholelithiasis or acute cholecystitis. Electronically Signed   By: Rolm Baptise M.D.   On: 04/28/2020 01:17    Microbiology Recent Results (from the past 240 hour(s))  Resp Panel by RT-PCR (Flu A&B, Covid) Nasopharyngeal Swab     Status: None   Collection Time: 04/18/2020  3:30 PM   Specimen: Nasopharyngeal Swab; Nasopharyngeal(NP) swabs in vial transport medium  Result Value Ref Range Status   SARS Coronavirus 2 by RT PCR NEGATIVE NEGATIVE Final    Comment: (NOTE) SARS-CoV-2 target nucleic acids are NOT DETECTED.  The SARS-CoV-2 RNA is generally detectable in upper respiratory specimens during the acute phase of infection. The lowest concentration of SARS-CoV-2 viral copies this assay can detect is 138 copies/mL. A negative result does not preclude SARS-Cov-2 infection and should not be used as the sole basis for treatment or other patient management decisions. A negative result may occur with  improper specimen collection/handling, submission of specimen other than nasopharyngeal swab, presence of viral mutation(s) within the areas targeted by this assay, and inadequate number of viral copies(<138 copies/mL). A negative result must be combined with clinical observations, patient history, and epidemiological information. The expected result is Negative.  Fact Sheet for Patients:   EntrepreneurPulse.com.au  Fact Sheet for Healthcare Providers:  IncredibleEmployment.be  This test is no t yet approved or cleared by the [place] FDA and  has been authorized for detection and/or diagnosis of SARS-CoV-2 by FDA under an Emergency Use Authorization (EUA). This EUA will remain  in effect (meaning this test can be used) for the duration of the COVID-19 declaration under Section 564(b)(1) of the Act, 21 U.S.C.section 360bbb-3(b)(1), unless the authorization is terminated  or revoked sooner.       Influenza A by PCR NEGATIVE NEGATIVE Final   Influenza B by PCR NEGATIVE NEGATIVE Final    Comment: (NOTE) The Xpert Xpress SARS-CoV-2/FLU/RSV plus assay is  intended as an aid in the diagnosis of influenza from Nasopharyngeal swab specimens and should not be used as a sole basis for treatment. Nasal washings and aspirates are unacceptable for Xpert Xpress SARS-CoV-2/FLU/RSV testing.  Fact Sheet for Patients: EntrepreneurPulse.com.au  Fact Sheet for Healthcare Providers: IncredibleEmployment.be  This test is not yet approved or cleared by the [place] FDA and has been authorized for detection and/or diagnosis of SARS-CoV-2 by FDA under an Emergency Use Authorization (EUA). This EUA will remain in effect (meaning this test can be used) for the duration of the COVID-19 declaration under Section 564(b)(1) of the Act, 21 U.S.C. section 360bbb-3(b)(1), unless the authorization is terminated or revoked.  Performed at [facility], [address] [place], [place] [address]   MRSA PCR Screening     Status: None   Collection Time: 05/06/2020  9:35 PM   Specimen: Nasal Mucosa; Nasopharyngeal  Result Value Ref Range Status   MRSA by PCR NEGATIVE NEGATIVE Final    Comment:        The GeneXpert MRSA Assay (FDA approved for NASAL specimens only), is one component of a comprehensive MRSA  colonization surveillance program. It is not intended to diagnose MRSA infection nor to guide or monitor treatment for MRSA infections. Performed at [facility], [place] [address], [place], [place] [address]   Culture, Respiratory w Gram Stain     Status: None   Collection Time: 05/04/20 10:18 AM   Specimen: Tracheal Aspirate; Respiratory  Result Value Ref Range Status   Specimen Description TRACHEAL ASPIRATE  Final   Special Requests NONE  Final   Gram Stain   Final    ABUNDANT WBC PRESENT,BOTH PMN AND MONONUCLEAR ABUNDANT GRAM NEGATIVE RODS MODERATE GRAM POSITIVE COCCI IN PAIRS IN CLUSTERS MODERATE GRAM POSITIVE RODS RARE BUDDING YEAST SEEN Performed at [facility], [place] [address], [place], [place] [address]    Culture   Final    ABUNDANT ESCHERICHIA COLI ABUNDANT METHICILLIN RESISTANT STAPHYLOCOCCUS AUREUS ABUNDANT STENOTROPHOMONAS [place]    Report Status May 09, 2020 FINAL  Final   Organism ID, Bacteria ESCHERICHIA COLI  Final   Organism ID, Bacteria METHICILLIN RESISTANT STAPHYLOCOCCUS AUREUS  Final   Organism ID, Bacteria STENOTROPHOMONAS MALTOPHILIA  Final      Susceptibility   Escherichia coli - MIC*    AMPICILLIN >=32 RESISTANT Resistant     CEFAZOLIN <=4 SENSITIVE Sensitive     CEFEPIME <=0.12 SENSITIVE Sensitive     CEFTAZIDIME <=1 SENSITIVE Sensitive     CEFTRIAXONE <=0.25 SENSITIVE Sensitive     CIPROFLOXACIN 1 SENSITIVE Sensitive     GENTAMICIN >=16 RESISTANT Resistant     IMIPENEM <=0.25 SENSITIVE Sensitive     TRIMETH/SULFA <=20 SENSITIVE Sensitive     AMPICILLIN/SULBACTAM >=32 RESISTANT Resistant     PIP/TAZO <=4 SENSITIVE Sensitive     * ABUNDANT ESCHERICHIA COLI   Methicillin resistant staphylococcus aureus - MIC*    CIPROFLOXACIN >=8 RESISTANT Resistant     ERYTHROMYCIN >=8 RESISTANT Resistant     GENTAMICIN <=0.5 SENSITIVE Sensitive     OXACILLIN >=4 RESISTANT Resistant     TETRACYCLINE <=1 SENSITIVE Sensitive     VANCOMYCIN 1  SENSITIVE Sensitive     TRIMETH/SULFA <=10 SENSITIVE Sensitive     CLINDAMYCIN <=0.25 SENSITIVE Sensitive     RIFAMPIN <=0.5 SENSITIVE Sensitive     Inducible Clindamycin NEGATIVE Sensitive     * ABUNDANT METHICILLIN RESISTANT STAPHYLOCOCCUS AUREUS   Stenotrophomonas maltophilia - MIC*    LEVOFLOXACIN 0.5  SENSITIVE Sensitive     TRIMETH/SULFA <=20 SENSITIVE Sensitive     * ABUNDANT STENOTROPHOMONAS MALTOPHILIA    Lab Basic Metabolic Panel: Recent Labs  Lab 05/03/20 0340 05/04/20 0130 05/05/20 0334 05/06/20 0247 2020-05-25 0009  NA 158* 147* 141 143 144  K 3.4* 4.3 4.4 4.6 4.7  CL 130* 117* 107 106 109  CO2 23 24 24 23 24   GLUCOSE 189* 202* 185* 193* 196*  BUN 56* 44* 39* 47* 66*  CREATININE 1.78* 1.46* 1.22 1.42* 1.82*  CALCIUM 7.6* 7.5* 7.8* 8.5* 8.4*  MG 3.0*  --   --   --   --    Liver Function Tests: Recent Labs  Lab 05/01/20 0711 05/03/20 0340  AST 395* 130*  ALT 802* 457*  ALKPHOS 88 101  BILITOT 2.3* 2.5*  PROT 5.2* 4.7*  ALBUMIN 2.7* 2.1*   No results for input(s): LIPASE, AMYLASE in the last 168 hours. No results for input(s): AMMONIA in the last 168 hours. CBC: Recent Labs  Lab 05/01/20 0533 05/03/20 0340 05/04/20 0130 05/05/20 0334 05/06/20 0247 May 25, 2020 0009  WBC 16.4* 17.5* 18.6* 14.7* 11.1* 9.1  NEUTROABS 14.6* 15.2*  --   --   --   --   HGB 9.5* 9.0* 9.1* 9.3* 9.4* 9.3*  HCT 28.7* 26.8* 26.7* 26.7* 26.4* 27.3*  MCV 112.5* 110.3* 108.5* 106.4* 104.3* 106.2*  PLT PLATELET CLUMPS NOTED ON SMEAR, UNABLE TO ESTIMATE 29* 23* 25* 34* 45*   Cardiac Enzymes: No results for input(s): CKTOTAL, CKMB, CKMBINDEX, TROPONINI in the last 168 hours. Sepsis Labs: Recent Labs  Lab 05/04/20 0130 05/05/20 0334 05/06/20 0247 May 25, 2020 0009  WBC 18.6* 14.7* 11.1* 9.1    Procedures/Operations    Terry Boyle May 25, 2020, 2:24 PM

## 2020-05-10 NOTE — Progress Notes (Deleted)
Increased work of breathing, not alert, minimal cough response to suctioning

## 2020-05-10 NOTE — Progress Notes (Signed)
Increased work of breathing, not alert, minimal cough response to suctioning 

## 2020-05-10 NOTE — Progress Notes (Signed)
Wasted 28 ml of hydromorphone with Lyla Son, RN.

## 2020-05-10 DEATH — deceased

## 2020-05-27 ENCOUNTER — Ambulatory Visit: Payer: Medicaid Other | Admitting: Family Medicine

## 2022-05-26 IMAGING — CT CT CHEST-ABD-PELV W/O CM
2 of 5 series · 13 of 46 positions shown, 15 images · non-contrast
Comparison: Abdomen and pelvis CT 06/25/2019

CLINICAL DATA: Sepsis.  Unresponsive.

EXAM:
CT CHEST, ABDOMEN AND PELVIS WITHOUT CONTRAST
TECHNIQUE: Multidetector CT imaging of the chest, abdomen and pelvis was
performed following the standard protocol without IV contrast.

[Series 4: cap w/o 2.0 mm (person_name) (person_name) · axial · non-contrast · 0.98mm/px · z∈[+911,+1511]mm · 10 of 344 slices shown, 12 images]
[im 22/344  soft-tissue]
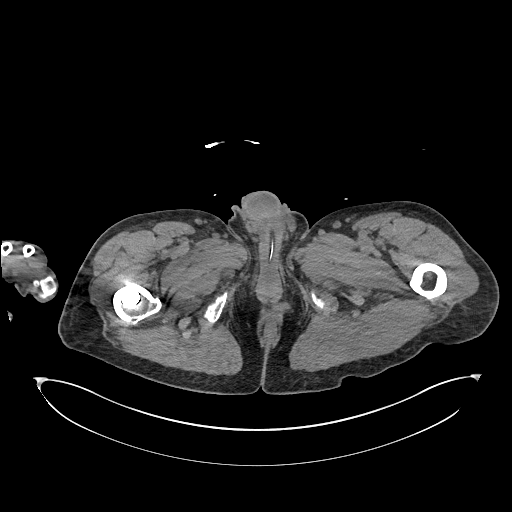
[im 22/344  bone]
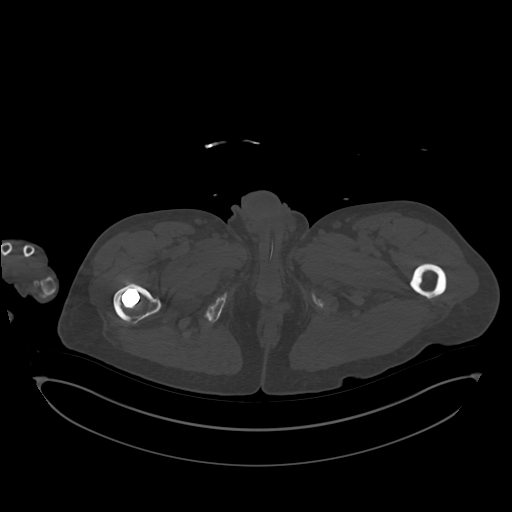
[im 65/344  soft-tissue]
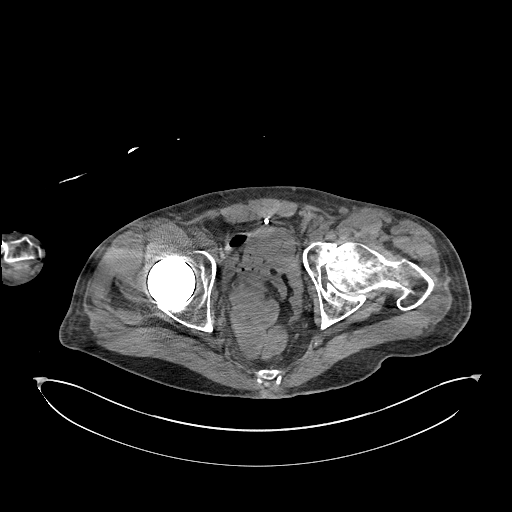
[im 86/344  soft-tissue]
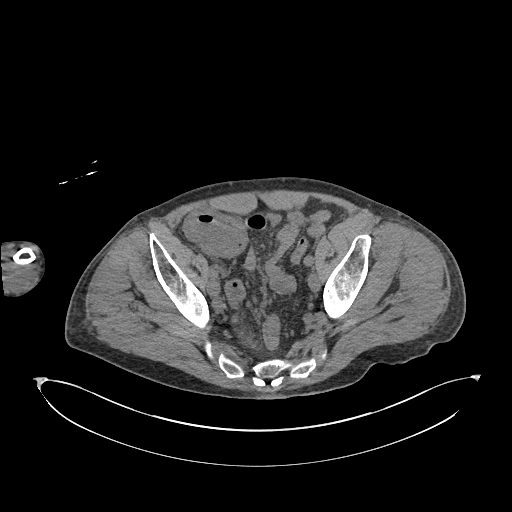
[im 129/344  soft-tissue]
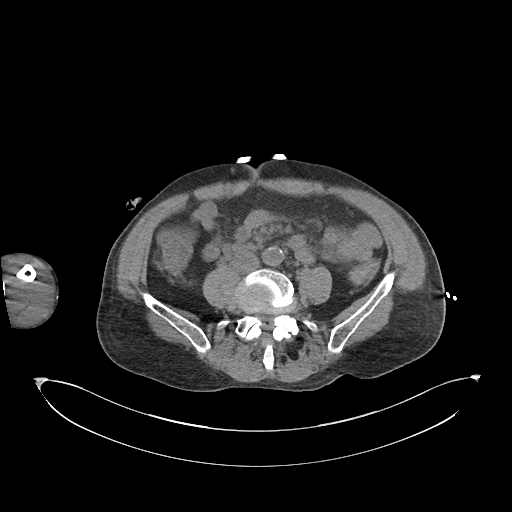
[im 151/344  soft-tissue]
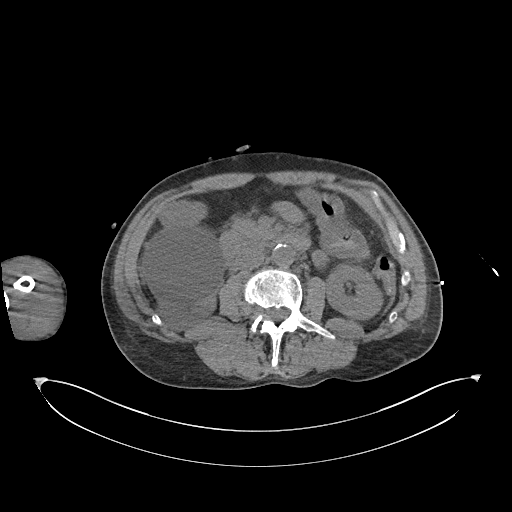
[im 193/344  soft-tissue]
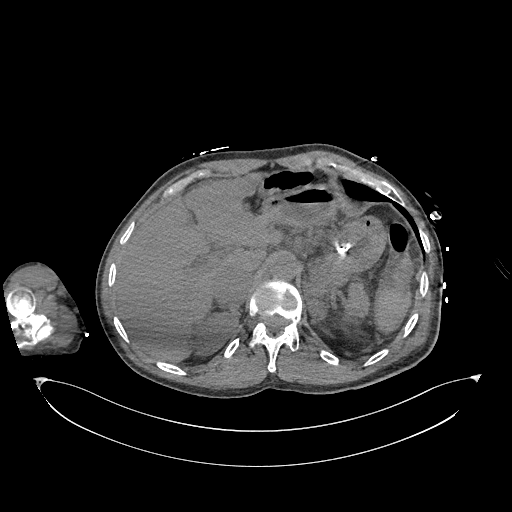
[im 215/344  soft-tissue]
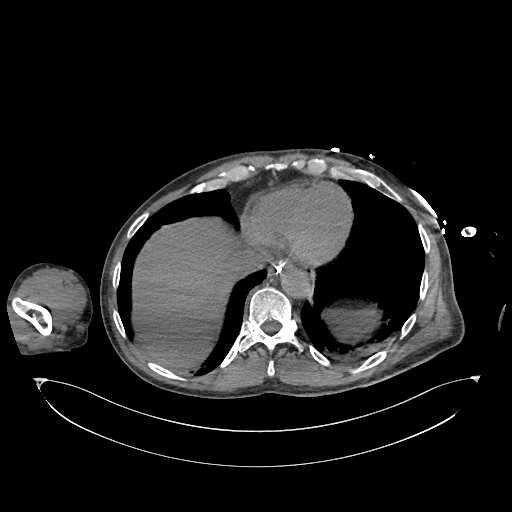
[im 258/344  soft-tissue]
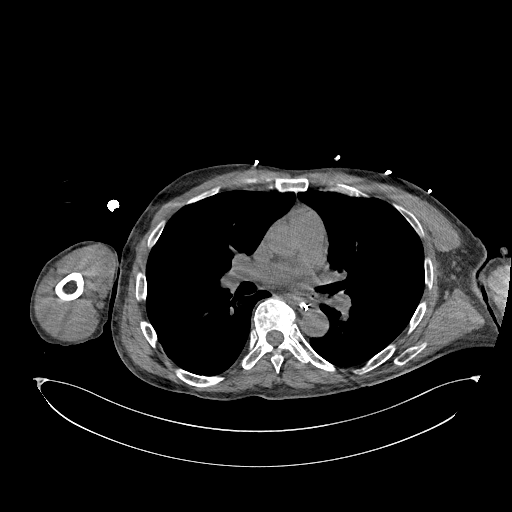
[im 279/344  soft-tissue]
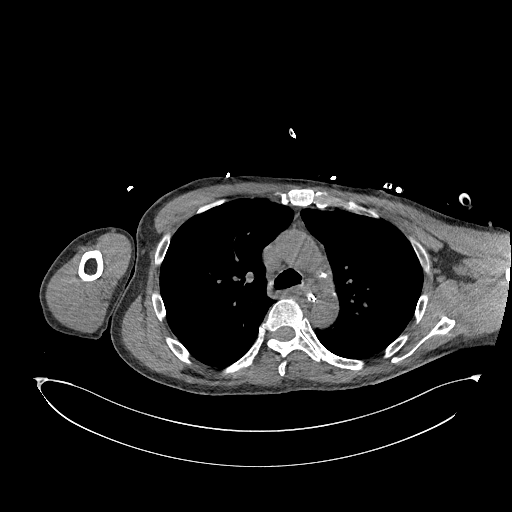
[im 279/344  bone]
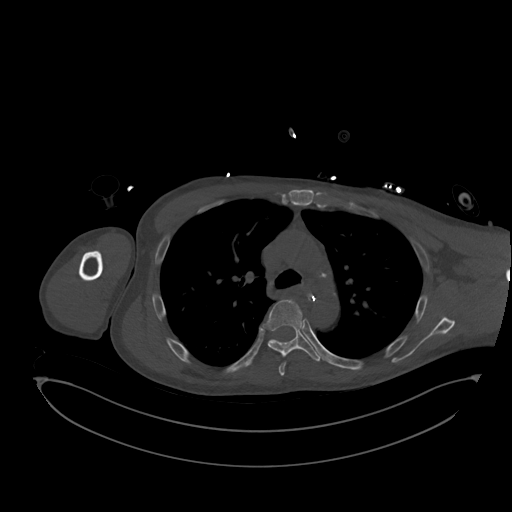
[im 322/344  soft-tissue]
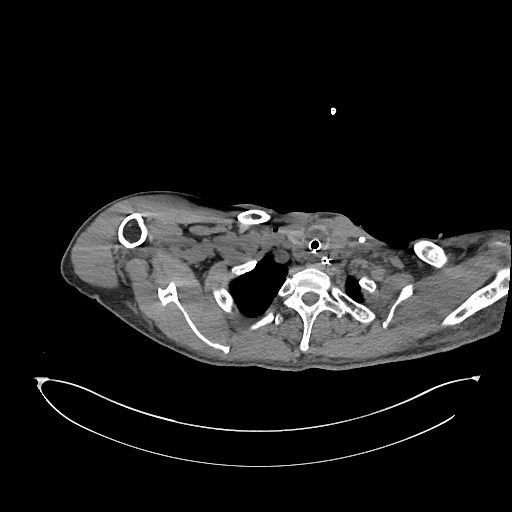

[Series 7: cap w/o 3.0 mm st cor · coronal · non-contrast · 0.82mm/px · 3 of 111 slices shown]
[im 37/111  soft-tissue]
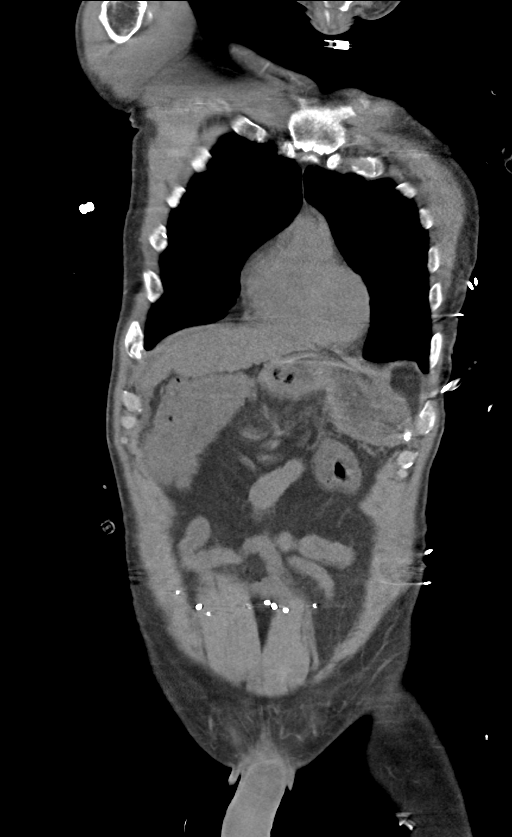
[im 49/111  soft-tissue]
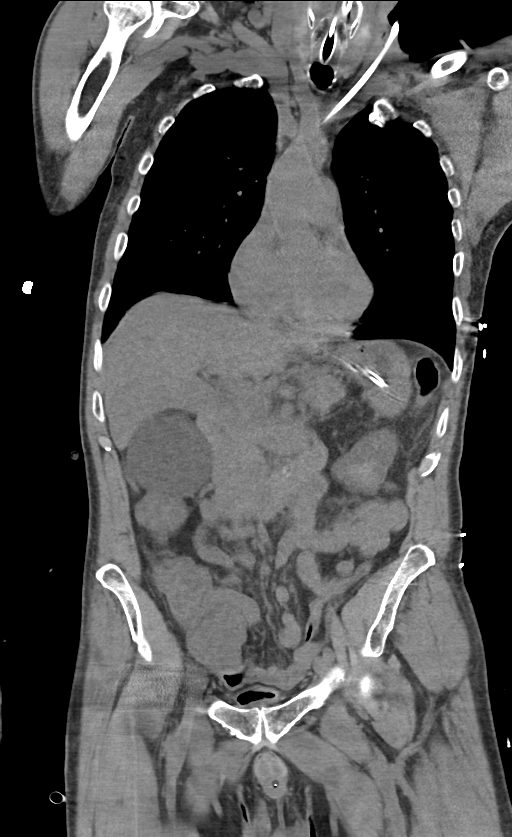
[im 62/111  soft-tissue]
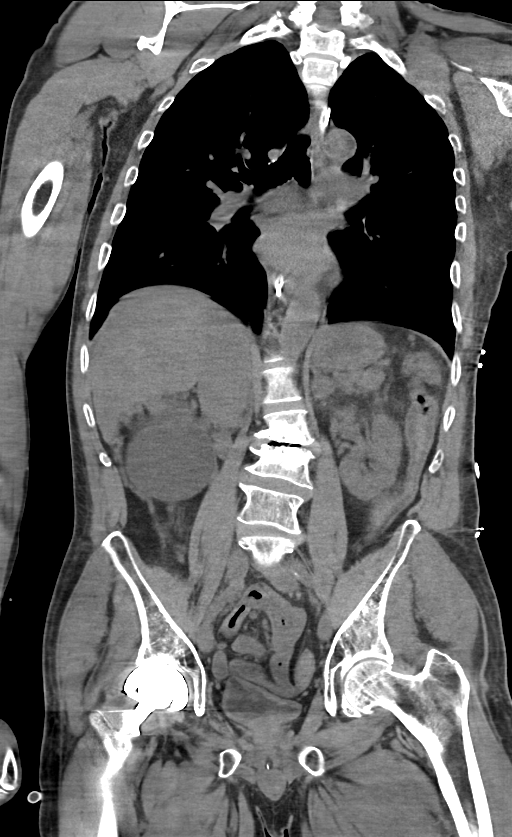

[13 of 46 positions shown; findings below may reference images not displayed]

FINDINGS: CT CHEST FINDINGS

Cardiovascular: The heart size is normal. No substantial pericardial
effusion. Atherosclerotic calcification is noted in the wall of the
thoracic aorta. Left IJ central line tip is in the left innominate
vein.

Mediastinum/Nodes: No mediastinal lymphadenopathy. No evidence for
gross hilar lymphadenopathy although assessment is limited by the
lack of intravenous contrast on today's study. Endotracheal tube
evident. NG tube passes into the stomach. There is no axillary
lymphadenopathy.

Lungs/Pleura: 5 mm right lower lobe nodule identified on image
123/6. Minimal dependent atelectasis is noted in the lower lobes
bilaterally. No substantial pleural effusion.

Musculoskeletal: No worrisome lytic or sclerotic osseous
abnormality.

CT ABDOMEN PELVIS FINDINGS

Hepatobiliary: Nodular hepatic contour compatible with cirrhosis.
Gallbladder is distended with layering sludge. No intrahepatic or
extrahepatic biliary dilation.

Pancreas: No focal mass lesion. No dilatation of the main duct. No
intraparenchymal cyst. No peripancreatic edema.

Spleen: No splenomegaly. No focal mass lesion.

Adrenals/Urinary Tract: 2.6 cm left adrenal nodule is similar to
prior and characterized as adenoma on MRI of 10/30/2019. Large right
renal cyst again noted measuring 7.8 cm, stable since 06/25/2019 as
is the adjacent tiny exophytic 2.3 cm lower pole cyst. Small cyst
posterior left kidney is similar. Other tiny low-density lesion seen
in the kidneys previously are not evident today given lack of
intravenous contrast. No evidence for hydroureter. Foley catheter
decompresses the urinary bladder. Air in the bladder lumen is
compatible with the instrumentation.

Stomach/Bowel: NG tube tip is in the stomach. Duodenum is normally
positioned as is the ligament of Treitz. No small bowel wall
thickening. No small bowel dilatation. The terminal ileum is normal.
The appendix is not well visualized, but there is no edema or
inflammation in the region of the cecum. Circumferential wall
thickening and edema seen in the right and transverse colon and in a
segment of the distal transverse colon.

Vascular/Lymphatic: There is abdominal aortic atherosclerosis
without aneurysm. There is no gastrohepatic or hepatoduodenal
ligament lymphadenopathy. No retroperitoneal or mesenteric
lymphadenopathy. No pelvic sidewall lymphadenopathy.

Reproductive: The prostate gland and seminal vesicles are
unremarkable.

Other: No substantial intraperitoneal free fluid.

Musculoskeletal: Status post right hip replacement thoracolumbar
scoliosis evident. No worrisome lytic or sclerotic osseous
abnormality.
IMPRESSION: 1. Circumferential wall thickening and edema in the right colon and
splenic flexure as well as a segment of the distal transverse colon.
Imaging features are compatible with infectious/inflammatory
colitis.
2. 5 mm right lower lobe pulmonary nodule new since 06/25/2019. No
follow-up needed if patient is low-risk. Non-contrast chest CT can
be considered in 12 months if patient is high-risk. This
recommendation follows the consensus statement: Guidelines for
Management of Incidental Pulmonary Nodules Detected on CT Images:
3. Cirrhotic liver morphology.
4. Stable left adrenal adenoma.
5. Bilateral renal cysts, similar to prior.
6. Aortic Atherosclerosis ([id number]).

## 2022-05-26 IMAGING — DX DG CHEST 1V PORT
1 series · 1 of 1 positions shown · non-contrast
Comparison: One-view chest x-ray 04/27/20

CLINICAL DATA: Underlying placement.

EXAM:
PORTABLE CHEST 1 VIEW

[chest ap]
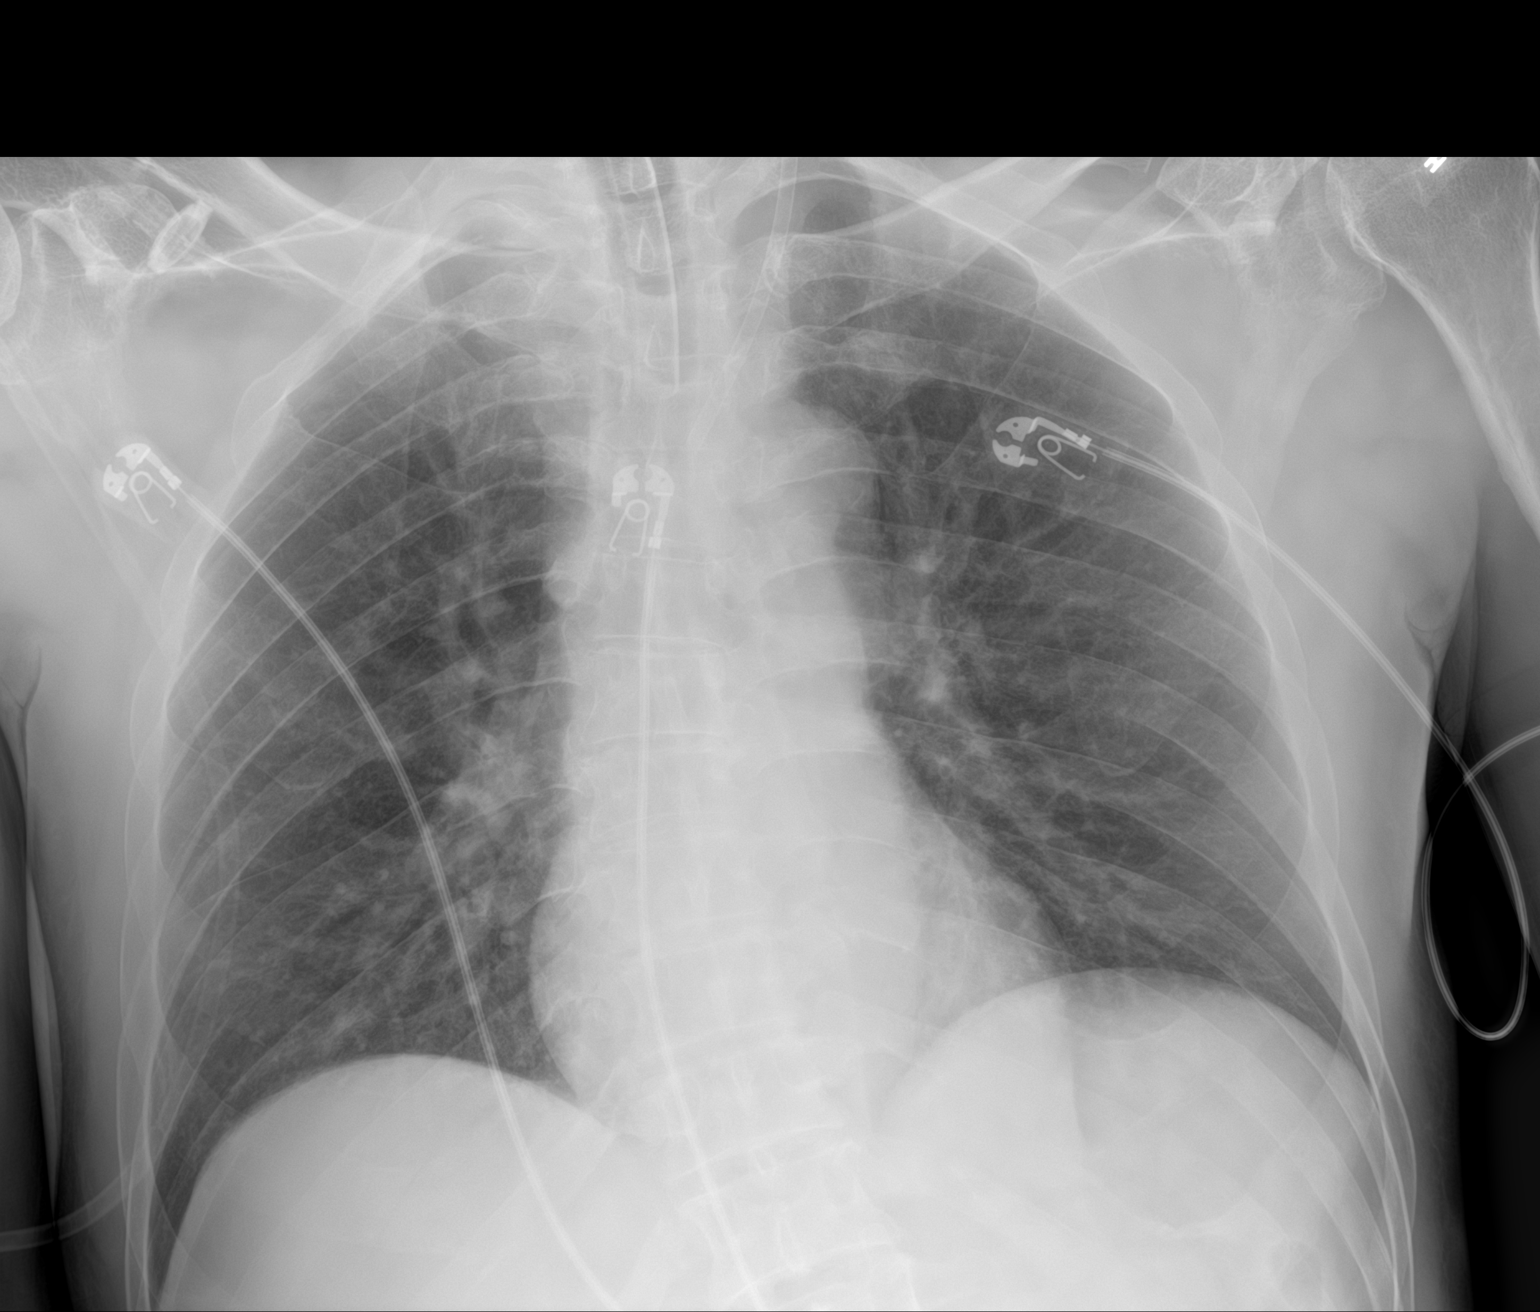

[1 of 1 positions shown; findings below may reference images not displayed]

FINDINGS: The heart size is normal. Endotracheal tube is stable. New left IJ
sheath is in place. The tip is in the innominate vein. Mild
pulmonary vascular congestion is present. No edema or effusion is
present. No focal airspace is present. Lung volumes are low.
IMPRESSION: 1. New left IJ sheath with the tip in the innominate vein.
2. Stable endotracheal tube.
3. Mild pulmonary vascular congestion.
